# Patient Record
Sex: Male | Born: 1955
Health system: Southern US, Community
[De-identification: ages and names within clinical notes are randomized; demographics above are authoritative.]

## PROBLEM LIST (undated history)

## (undated) DIAGNOSIS — F419 Anxiety disorder, unspecified: Secondary | ICD-10-CM

## (undated) DIAGNOSIS — F32A Depression, unspecified: Secondary | ICD-10-CM

## (undated) DIAGNOSIS — H8109 Meniere's disease, unspecified ear: Secondary | ICD-10-CM

## (undated) DIAGNOSIS — F329 Major depressive disorder, single episode, unspecified: Secondary | ICD-10-CM

## (undated) HISTORY — PX: APPENDECTOMY: SHX54

## (undated) HISTORY — PX: INNER EAR SURGERY: SHX679

## (undated) HISTORY — PX: TONSILLECTOMY: SUR1361

## (undated) HISTORY — PX: SHOULDER ARTHROSCOPY: SHX128

---

## 2005-05-25 ENCOUNTER — Encounter: Admission: RE | Admit: 2005-05-25 | Discharge: 2005-05-25 | Payer: Self-pay | Admitting: Family Medicine

## 2005-06-06 ENCOUNTER — Encounter: Admission: RE | Admit: 2005-06-06 | Discharge: 2005-06-06 | Payer: Self-pay | Admitting: Family Medicine

## 2009-05-21 ENCOUNTER — Encounter: Admission: RE | Admit: 2009-05-21 | Discharge: 2009-05-21 | Payer: Self-pay | Admitting: Family Medicine

## 2009-09-02 ENCOUNTER — Encounter: Admission: RE | Admit: 2009-09-02 | Discharge: 2009-09-02 | Payer: Self-pay | Admitting: Family Medicine

## 2012-12-14 ENCOUNTER — Other Ambulatory Visit: Payer: Self-pay | Admitting: Otolaryngology

## 2012-12-14 DIAGNOSIS — H905 Unspecified sensorineural hearing loss: Secondary | ICD-10-CM

## 2012-12-14 DIAGNOSIS — H903 Sensorineural hearing loss, bilateral: Secondary | ICD-10-CM

## 2012-12-14 DIAGNOSIS — H8109 Meniere's disease, unspecified ear: Secondary | ICD-10-CM

## 2012-12-17 ENCOUNTER — Ambulatory Visit
Admission: RE | Admit: 2012-12-17 | Discharge: 2012-12-17 | Disposition: A | Discharge status: Home / Self Care | Payer: BC Managed Care – PPO | Source: Ambulatory Visit | Attending: Otolaryngology | Admitting: Otolaryngology

## 2012-12-17 DIAGNOSIS — H905 Unspecified sensorineural hearing loss: Secondary | ICD-10-CM

## 2012-12-17 DIAGNOSIS — H8109 Meniere's disease, unspecified ear: Secondary | ICD-10-CM

## 2012-12-17 DIAGNOSIS — H903 Sensorineural hearing loss, bilateral: Secondary | ICD-10-CM

## 2012-12-17 MED ORDER — GADOBENATE DIMEGLUMINE 529 MG/ML IV SOLN
16.0000 mL | Freq: Once | INTRAVENOUS | Status: AC | PRN
  Administered 2012-12-17: 16 mL via INTRAVENOUS

## 2013-06-25 ENCOUNTER — Other Ambulatory Visit: Payer: Self-pay | Admitting: Family Medicine

## 2013-06-25 DIAGNOSIS — R109 Unspecified abdominal pain: Secondary | ICD-10-CM

## 2013-06-25 DIAGNOSIS — R630 Anorexia: Secondary | ICD-10-CM

## 2013-06-26 ENCOUNTER — Ambulatory Visit
Admission: RE | Admit: 2013-06-26 | Discharge: 2013-06-26 | Disposition: A | Discharge status: Home / Self Care | Payer: BC Managed Care – PPO | Source: Ambulatory Visit | Attending: Family Medicine | Admitting: Family Medicine

## 2013-06-26 DIAGNOSIS — R630 Anorexia: Secondary | ICD-10-CM

## 2013-06-26 DIAGNOSIS — R109 Unspecified abdominal pain: Secondary | ICD-10-CM

## 2013-06-26 MED ORDER — IOHEXOL 300 MG/ML  SOLN
100.0000 mL | Freq: Once | INTRAMUSCULAR | Status: AC | PRN
  Administered 2013-06-26: 100 mL via INTRAVENOUS

## 2013-10-04 ENCOUNTER — Other Ambulatory Visit: Payer: Self-pay | Admitting: Family Medicine

## 2013-10-04 DIAGNOSIS — N6310 Unspecified lump in the right breast, unspecified quadrant: Secondary | ICD-10-CM

## 2013-10-15 ENCOUNTER — Ambulatory Visit
Admission: RE | Admit: 2013-10-15 | Discharge: 2013-10-15 | Disposition: A | Discharge status: Home / Self Care | Payer: BC Managed Care – PPO | Source: Ambulatory Visit | Attending: Family Medicine | Admitting: Family Medicine

## 2013-10-15 DIAGNOSIS — N6310 Unspecified lump in the right breast, unspecified quadrant: Secondary | ICD-10-CM

## 2013-12-27 ENCOUNTER — Emergency Department (HOSPITAL_COMMUNITY): Payer: BC Managed Care – PPO

## 2013-12-27 ENCOUNTER — Encounter (HOSPITAL_COMMUNITY): Payer: Self-pay | Admitting: Emergency Medicine

## 2013-12-27 ENCOUNTER — Emergency Department (HOSPITAL_COMMUNITY)
Admission: EM | Admit: 2013-12-27 | Discharge: 2013-12-27 | Disposition: A | Discharge status: Home / Self Care | Payer: BC Managed Care – PPO | Attending: Emergency Medicine | Admitting: Emergency Medicine

## 2013-12-27 DIAGNOSIS — Z79899 Other long term (current) drug therapy: Secondary | ICD-10-CM | POA: Insufficient documentation

## 2013-12-27 DIAGNOSIS — R112 Nausea with vomiting, unspecified: Secondary | ICD-10-CM | POA: Insufficient documentation

## 2013-12-27 DIAGNOSIS — F329 Major depressive disorder, single episode, unspecified: Secondary | ICD-10-CM | POA: Insufficient documentation

## 2013-12-27 DIAGNOSIS — F17200 Nicotine dependence, unspecified, uncomplicated: Secondary | ICD-10-CM | POA: Insufficient documentation

## 2013-12-27 DIAGNOSIS — F411 Generalized anxiety disorder: Secondary | ICD-10-CM | POA: Insufficient documentation

## 2013-12-27 DIAGNOSIS — F1010 Alcohol abuse, uncomplicated: Secondary | ICD-10-CM | POA: Insufficient documentation

## 2013-12-27 DIAGNOSIS — H8109 Meniere's disease, unspecified ear: Secondary | ICD-10-CM | POA: Insufficient documentation

## 2013-12-27 DIAGNOSIS — F3289 Other specified depressive episodes: Secondary | ICD-10-CM | POA: Insufficient documentation

## 2013-12-27 HISTORY — DX: Major depressive disorder, single episode, unspecified: F32.9

## 2013-12-27 HISTORY — DX: Anxiety disorder, unspecified: F41.9

## 2013-12-27 HISTORY — DX: Depression, unspecified: F32.A

## 2013-12-27 HISTORY — DX: Meniere's disease, unspecified ear: H81.09

## 2013-12-27 LAB — COMPREHENSIVE METABOLIC PANEL
ALT: 15 U/L (ref 0–53)
AST: 16 U/L (ref 0–37)
Albumin: 4.1 g/dL (ref 3.5–5.2)
Alkaline Phosphatase: 41 U/L (ref 39–117)
BUN: 12 mg/dL (ref 6–23)
CO2: 20 mEq/L (ref 19–32)
Calcium: 9.5 mg/dL (ref 8.4–10.5)
Chloride: 104 mEq/L (ref 96–112)
Creatinine, Ser: 0.88 mg/dL (ref 0.50–1.35)
GFR calc Af Amer: >90 mL/min (ref >90)
GFR calc non Af Amer: >90 mL/min (ref >90)
Glucose, Bld: 119 mg/dL — ABNORMAL HIGH (ref 70–99)
Potassium: 4.2 mEq/L (ref 3.7–5.3)
Sodium: 141 mEq/L (ref 137–147)
Total Bilirubin: 0.4 mg/dL (ref 0.3–1.2)
Total Protein: 7.7 g/dL (ref 6.0–8.3)

## 2013-12-27 LAB — URINE MICROSCOPIC-ADD ON

## 2013-12-27 LAB — CBC WITH DIFFERENTIAL/PLATELET
Basophils Absolute: 0.1 10*3/uL (ref 0.0–0.1)
Basophils Relative: 1 % (ref 0–1)
Eosinophils Absolute: 0.1 10*3/uL (ref 0.0–0.7)
Eosinophils Relative: 1 % (ref 0–5)
HCT: 49.5 % (ref 39.0–52.0)
Hemoglobin: 17.9 g/dL — ABNORMAL HIGH (ref 13.0–17.0)
Lymphocytes Relative: 11 % — ABNORMAL LOW (ref 12–46)
Lymphs Abs: 1.4 10*3/uL (ref 0.7–4.0)
MCH: 34.7 pg — ABNORMAL HIGH (ref 26.0–34.0)
MCHC: 36.2 g/dL — ABNORMAL HIGH (ref 30.0–36.0)
MCV: 95.9 fL (ref 78.0–100.0)
Monocytes Absolute: 0.7 10*3/uL (ref 0.1–1.0)
Monocytes Relative: 5 % (ref 3–12)
Neutro Abs: 10.9 10*3/uL — ABNORMAL HIGH (ref 1.7–7.7)
Neutrophils Relative %: 83 % — ABNORMAL HIGH (ref 43–77)
Platelets: 297 10*3/uL (ref 150–400)
RBC: 5.16 MIL/uL (ref 4.22–5.81)
RDW: 13.2 % (ref 11.5–15.5)
WBC: 13 10*3/uL — ABNORMAL HIGH (ref 4.0–10.5)

## 2013-12-27 LAB — URINALYSIS, ROUTINE W REFLEX MICROSCOPIC
Bilirubin Urine: NEGATIVE
Glucose, UA: NEGATIVE mg/dL
Hgb urine dipstick: NEGATIVE
Ketones, ur: 15 mg/dL — AB
Leukocytes, UA: NEGATIVE
Nitrite: NEGATIVE
Protein, ur: 100 mg/dL — AB
Specific Gravity, Urine: 1.027 (ref 1.005–1.030)
Urobilinogen, UA: 1 mg/dL (ref 0.0–1.0)
pH: 8 (ref 5.0–8.0)

## 2013-12-27 LAB — LIPASE, BLOOD: Lipase: 35 U/L (ref 11–59)

## 2013-12-27 LAB — ETHANOL: Alcohol, Ethyl (B): <11 mg/dL (ref 0–11)

## 2013-12-27 LAB — POCT I-STAT TROPONIN I: Troponin i, poc: 0 ng/mL (ref 0.00–0.08)

## 2013-12-27 MED ORDER — ONDANSETRON HCL 4 MG/2ML IJ SOLN: 4.0000 mg | Freq: Once | INTRAMUSCULAR | Status: DC

## 2013-12-27 MED ORDER — DICYCLOMINE HCL 20 MG PO TABS: 20.0000 mg | ORAL_TABLET | Freq: Two times a day (BID) | ORAL | Status: DC

## 2013-12-27 MED ORDER — MORPHINE SULFATE 4 MG/ML IJ SOLN
4.0000 mg | Freq: Once | INTRAMUSCULAR | Status: AC
  Administered 2013-12-27: 4 mg via INTRAVENOUS
  Filled 2013-12-27: qty 1

## 2013-12-27 MED ORDER — SODIUM CHLORIDE 0.9 % IV BOLUS (SEPSIS)
1000.0000 mL | Freq: Once | INTRAVENOUS | Status: AC
  Administered 2013-12-27: 1000 mL via INTRAVENOUS

## 2013-12-27 MED ORDER — ONDANSETRON 4 MG PO TBDP: 4.0000 mg | ORAL_TABLET | Freq: Three times a day (TID) | ORAL | Status: DC | PRN

## 2013-12-27 MED ORDER — METOCLOPRAMIDE HCL 5 MG/ML IJ SOLN
10.0000 mg | Freq: Once | INTRAMUSCULAR | Status: AC
  Administered 2013-12-27: 10 mg via INTRAVENOUS

## 2013-12-27 MED ORDER — SODIUM CHLORIDE 0.9 % IV SOLN
1000.0000 mL | Freq: Once | INTRAVENOUS | Status: AC
  Administered 2013-12-27: 1000 mL via INTRAVENOUS

## 2013-12-27 MED ORDER — METOCLOPRAMIDE HCL 5 MG/ML IJ SOLN
10.0000 mg | Freq: Once | INTRAMUSCULAR | Status: DC
  Filled 2013-12-27: qty 2

## 2013-12-27 NOTE — Discharge Instructions (Signed)
Take the prescribed medication as directed.  Continue drinking plenty of fluids to keep yourself hydrated. May wish to start with bland diet and progress as tolerated. Follow-up with [facility] if problems occur. Return to the ED for new or worsening symptoms-- uncontrollable nausea and vomiting, sever abdominal pain, chest pain, SOB, dizziness, diaphoresis, etc.

## 2013-12-27 NOTE — ED Provider Notes (Signed)
CSN: [id number]     Arrival date & time 12/27/13  4098 History   None    Chief Complaint  Patient presents with  . Emesis   (Consider location/radiation/quality/duration/timing/severity/associated sxs/prior Treatment) Patient is a 58 y.o. male presenting with vomiting. The history is provided by the patient and medical records.  Emesis  This is a 58 y.o. M with PMH significant for depression, anxiety, and meniere's disease, presenting to the ED for nausea and vomiting.  States he did not well the entire evening last night, did not eat anything for dinner.  Pt states he went to bed early, woke up around 0200 and began vomiting.  Estimates he has had approximately 8-10 episodes of non-bloody, non-bilious emesis throughout the course of the night.  States he had one episode of feeling as thought he aspirated some emesis into one of his lungs and caused him to feel SOB.  States he began coughing afterward and sx improved.  States he has had some diaphoresis with the vomiting.  Denies any chest pain, palpitations, dizziness, weakness, or abdominal pain.   No recent fevers or chills.  No recent sick contacts with similar sx.  No recent changes in diet or medications.  BM have been normal and non-bloody, last BM was yesterday morning.  Prior appendectomy, no other abdominal surgeries.  VS stable on arrival.  Past Medical History  Diagnosis Date  . Meniere disease   . Depression   . Anxiety    Past Surgical History  Procedure Laterality Date  . Appendectomy    . Tonsillectomy    . Inner ear surgery     History reviewed. No pertinent family history. History  Substance Use Topics  . Smoking status: Current Every Day Smoker -- 1.00 packs/day    Types: Cigarettes  . Smokeless tobacco: Not on file  . Alcohol Use: 1.2 oz/week    2 Shots of liquor per week     Comment: a couple shots every couple days    Review of Systems  Gastrointestinal: Positive for nausea and vomiting.  All other systems  reviewed and are negative.    Allergies  Review of patient's allergies indicates no known allergies.  Home Medications   Current Outpatient Rx  Name  Route  Sig  Dispense  Refill  . ALPRAZolam (XANAX) 0.5 MG tablet   Oral   Take 0.5 mg by mouth 2 (two) times daily.          . DULoxetine (CYMBALTA) 30 MG capsule   Oral   Take 90 mg by mouth daily.          . zaleplon (SONATA) 10 MG capsule   Oral   Take 10 mg by mouth at bedtime as needed for sleep.          BP 159/87  Pulse 69  Temp(Src) 97.9 F (36.6 C) (Oral)  Resp 20  Ht 5\' 8"  (1.727 m)  Wt 170 lb (77.111 kg)  BMI 25.85 kg/m2  SpO2 99%  Physical Exam  Nursing note and vitals reviewed. Constitutional: He is oriented to person, place, and time. He appears well-developed and well-nourished. No distress.  HENT:  Head: Normocephalic and atraumatic.  Mouth/Throat: Oropharynx is clear and moist.  Eyes: Conjunctivae and EOM are normal. Pupils are equal, round, and reactive to light.  Neck: Normal range of motion. Neck supple.  Cardiovascular: Normal rate, regular rhythm and normal heart sounds.   Pulmonary/Chest: Effort normal and breath sounds normal. No respiratory distress. He has  no wheezes.  Abdominal: Soft. Bowel sounds are normal. There is no tenderness. There is no guarding and no CVA tenderness.  Abdomen soft, non-distended, non-tender to palpation  Musculoskeletal: Normal range of motion. He exhibits no edema.  Neurological: He is alert and oriented to person, place, and time.  Skin: Skin is warm and dry. He is not diaphoretic.  Psychiatric: He has a normal mood and affect.    ED Course  Procedures (including critical care time) Labs Review Labs Reviewed  CBC WITH DIFFERENTIAL - Abnormal; Notable for the following:    WBC 13.0 (*)    Hemoglobin 17.9 (*)    MCH 34.7 (*)    MCHC 36.2 (*)    Neutrophils Relative % 83 (*)    Neutro Abs 10.9 (*)    Lymphocytes Relative 11 (*)    All other  components within normal limits  COMPREHENSIVE METABOLIC PANEL - Abnormal; Notable for the following:    Glucose, Bld 119 (*)    All other components within normal limits  URINALYSIS, ROUTINE W REFLEX MICROSCOPIC - Abnormal; Notable for the following:    APPearance CLOUDY (*)    Ketones, ur 15 (*)    Protein, ur 100 (*)    All other components within normal limits  ETHANOL  LIPASE, BLOOD  URINE MICROSCOPIC-ADD ON  POCT I-STAT TROPONIN I   Imaging Review Dg Chest Portable 1 View  12/27/2013   CLINICAL DATA:  Vomiting with possible aspiration.  EXAM: PORTABLE CHEST - 1 VIEW  COMPARISON:  None.  FINDINGS: Interstitial coarsening at the bases. There is minimal scar atelectasis at the right base. No asymmetric opacity, effusion, or pneumothorax. Normal heart size.  IMPRESSION: Interstitial coarsening which is bronchitic. No consolidation or edema.   Electronically Signed   By: Tiburcio PeaJonathan  Watts M.D.   On: 12/27/2013 04:30    EKG Interpretation   None       MDM   1. Nausea and vomiting    Labs as above, mild leukocytosis.  Pts wife expressed to nursing staff that she is concerned with pts EtOH intake, thinks he may have drank too much last night-- ethanol WNL.  On initial evaluation, pt is overall non-toxic appearing.  Mucus membranes somewhat dry, abdominal exam with some generalized tenderness.  Will give additional IVFB, reglan, and morphine.  Will reassess.  Repeat abdominal exam improved without TTP, patient states he just feels somewhat "sore" from all the retching earlier today. He is tolerating by mouth without recurrent nausea or vomiting.  At this time i have low suspicion for acute/surgical abdomen including SBO, diverticulitis, cholecystitis, etc.  Pt afebrile, non-toxic appearing, NAD, VS stable- ok for discharge.  Rx zofran and bentyl.  FU with [facility] if problems occur.  Discussed plan with pt and wife at bedside, acknowledged understanding and agreed with plan of  care.  Return precautions advised for new or worsening sx.  Garlon HatchetLisa M Islam Eichinger, PA-C 12/27/13 0820  Garlon HatchetLisa M Bridey Brookover, PA-C 12/27/13 [phone]

## 2013-12-27 NOTE — ED Notes (Signed)
Reports he did not feel well all evening, did not eat dinner.  Pt in bed resting when he began vomiting at 0030.  Reports vomiting 8-10 times and feels as if something went down his throat into his lung.  C/O shortness of breath.  +diaphoresis with vomiting.

## 2013-12-27 NOTE — ED Notes (Signed)
Iv removed from left ac 2x2 applied with tape.

## 2013-12-27 NOTE — ED Provider Notes (Signed)
Medical screening examination/treatment/procedure(s) were performed by non-physician practitioner and as supervising physician I was immediately available for consultation/collaboration.  EKG Interpretation   None         Christopher Meckley N Wrenn Willcox, DO 12/27/13 1620 

## 2013-12-27 NOTE — ED Notes (Signed)
Pt tolerating PO challenge, PA updated on status.

## 2014-01-09 ENCOUNTER — Other Ambulatory Visit: Payer: Self-pay | Admitting: Otolaryngology

## 2014-01-09 DIAGNOSIS — H903 Sensorineural hearing loss, bilateral: Secondary | ICD-10-CM

## 2014-01-09 DIAGNOSIS — H905 Unspecified sensorineural hearing loss: Secondary | ICD-10-CM

## 2014-01-09 DIAGNOSIS — H8109 Meniere's disease, unspecified ear: Secondary | ICD-10-CM

## 2014-01-15 ENCOUNTER — Ambulatory Visit
Admission: RE | Admit: 2014-01-15 | Discharge: 2014-01-15 | Disposition: A | Discharge status: Home / Self Care | Payer: BC Managed Care – PPO | Source: Ambulatory Visit | Attending: Otolaryngology | Admitting: Otolaryngology

## 2014-01-15 DIAGNOSIS — H905 Unspecified sensorineural hearing loss: Secondary | ICD-10-CM

## 2014-01-15 DIAGNOSIS — H8109 Meniere's disease, unspecified ear: Secondary | ICD-10-CM

## 2014-01-15 DIAGNOSIS — H903 Sensorineural hearing loss, bilateral: Secondary | ICD-10-CM

## 2014-01-15 MED ORDER — GADOBENATE DIMEGLUMINE 529 MG/ML IV SOLN
16.0000 mL | Freq: Once | INTRAVENOUS | Status: AC | PRN
  Administered 2014-01-15: 16 mL via INTRAVENOUS

## 2014-02-03 ENCOUNTER — Encounter: Payer: Self-pay | Admitting: Neurology

## 2014-02-03 ENCOUNTER — Ambulatory Visit (INDEPENDENT_AMBULATORY_CARE_PROVIDER_SITE_OTHER): Payer: BC Managed Care – PPO | Admitting: Neurology

## 2014-02-03 VITALS — BP 100/60 | HR 64 | Temp 97.5°F | Ht 68.0 in | Wt 170.2 lb

## 2014-02-03 DIAGNOSIS — H8190 Unspecified disorder of vestibular function, unspecified ear: Secondary | ICD-10-CM

## 2014-02-03 DIAGNOSIS — R42 Dizziness and giddiness: Secondary | ICD-10-CM

## 2014-02-03 DIAGNOSIS — H8109 Meniere's disease, unspecified ear: Secondary | ICD-10-CM

## 2014-02-03 DIAGNOSIS — H8120 Vestibular neuronitis, unspecified ear: Secondary | ICD-10-CM

## 2014-02-03 NOTE — Progress Notes (Signed)
NEUROLOGY CONSULTATION NOTE  Christopher Winters MRN: [id number] DOB: 09-16-1956  Referring provider: Dr. Dorma Russell Primary care provider: Dr. Kevan Ny  Reason for consult:  Vertigo  HISTORY OF PRESENT ILLNESS: Christopher Winters is a 58 year old right-handed man with history of depression, anxiety and Meniere's disease who presents for headache.  Records and images were personally reviewed where available.    He was diagnosed with left sided Meniere's disease many years ago.  He underwent endolymphatic sec decompression 20 years ago.  He is followed for his Meniere's by Dr. Dorma Russell.  He has constant left ear tinnitus.  His typical attacks start with sensation of burning on the back of his head and neck.  He develops both lightheadedness and spinning sensation, associated with fullness in the left ear and nausea.  There is no associated headache or suboccipital tenderness.  He also has a sensation that his "head is not connected to my body."  Typically the attacks can last several days.  He has about 3 or 4 attacks a year.  Last December, he had a particularly more severe attack.  He was bed-bound for several days.  He also had one day of projectile vomiting lasting several hours.  He was unable to eat.  This caused worsening anxiety attacks.  He went to the ED where he was diagnosed with dehydration and treated with Zofan and IV fluids.  He has been treated with gentamycin injections which have lessened the intensity of vertigo over the years.  Triggers for attacks include change in temperature or barometric changes, or increased anxiety.  Despite these attacks, his hearing has been stable and low-frequency hearing has actually improved in the left ear.  He does have a history of anxiety and depression as well, and is followed by a psychiatrist.  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  He has been prescribed Klonopin, which helps.  He also has history  of stomach problems, which have more recently been under control with acid reflux medication.  Audiogram from 01/08/14 revealed: bilateral asymmetric low and high-frequency sensorineural hearing losses, left ear greater than right ear, with an SRT of 15 dB right ear and 40 dB left ear.  He had 96% discrimination right ear and 80% discrimination left ear.  He has had an increase in his hearing of 30 DB at 1000 Hz and 20 DB at 2000 Hz in the left ear and his right ear is stable.  01/16/14 MRI Brain w/wo: No acute intracranial abnormality.  Mild Chiari malformation (cerebellar tonsils 7.5 mm below foramen magnum) unchanged when compared to prior study from 12/17/12.  He also notes prior history of migraines, described as severe pounding headache associated with nausea.  He hasn't had one in about 2 years.  He also has a remote history of drop attacks, in which he momentarily drops to the ground without loss of consciousness.  This has been attributed to his vestibulopathy.  He also reports occasional numbness in the fingertips when he is working with his hands.  No wrist pain.  He has to shake out his hands.  No neck pain.  PAST MEDICAL HISTORY: Past Medical History  Diagnosis Date  . Meniere disease   . Depression   . Anxiety     PAST SURGICAL HISTORY: Past Surgical History  Procedure Laterality Date  . Appendectomy    . Tonsillectomy    . Inner ear surgery      MEDICATIONS:  Current Outpatient Prescriptions on File Prior to Visit  Medication Sig Dispense Refill  . DULoxetine (CYMBALTA) 30 MG capsule Take 90 mg by mouth daily.       . ondansetron (ZOFRAN ODT) 4 MG disintegrating tablet Take 1 tablet (4 mg total) by mouth every 8 (eight) hours as needed for nausea.  10 tablet  0  . zaleplon (SONATA) 10 MG capsule Take 10 mg by mouth at bedtime as needed for sleep.       No current facility-administered medications on file prior to visit.    ALLERGIES: No Known Allergies  FAMILY  HISTORY: History reviewed. No pertinent family history.  SOCIAL HISTORY: History   Social History  . Marital Status: Married    Spouse Name: N/A    Number of Children: N/A  . Years of Education: N/A   Occupational History  . Not on file.   Social History Main Topics  . Smoking status: Current Every Day Smoker -- 1.00 packs/day    Types: Cigarettes  . Smokeless tobacco: Not on file  . Alcohol Use: 1.2 oz/week    2 Shots of liquor per week     Comment: a couple shots every couple days  . Drug Use: Yes    Special: Marijuana     Comment: 2 times weekly  . Sexual Activity: Yes   Other Topics Concern  . Not on file   Social History Narrative  . No narrative on file    REVIEW OF SYSTEMS: Constitutional: No fevers, chills, or sweats, no generalized fatigue, change in appetite Eyes: No visual changes, double vision, eye pain Ear, nose and throat: No hearing loss, ear pain, nasal congestion, sore throat Cardiovascular: No chest pain, palpitations Respiratory:  No shortness of breath at rest or with exertion, wheezes GastrointestinaI: No nausea, vomiting, diarrhea, abdominal pain, fecal incontinence Genitourinary:  No dysuria, urinary retention or frequency Musculoskeletal:  No neck pain, back pain Integumentary: No rash, pruritus, skin lesions Neurological: as above Psychiatric: No depression, insomnia, anxiety Endocrine: No palpitations, fatigue, diaphoresis, mood swings, change in appetite, change in weight, increased thirst Hematologic/Lymphatic:  No anemia, purpura, petechiae. Allergic/Immunologic: no itchy/runny eyes, nasal congestion, recent allergic reactions, rashes  PHYSICAL EXAM: Filed Vitals:   02/03/14 0900  BP: 100/60  Pulse: 64  Temp: 97.5 F (36.4 C)   General: No acute distress Head:  Normocephalic/atraumatic Neck: supple, no paraspinal tenderness, full range of motion Back: No paraspinal tenderness Heart: regular rate and rhythm Lungs: Clear to  auscultation bilaterally. Vascular: No carotid bruits. Neurological Exam: Mental status: alert and oriented to person, place, and time, speech fluent and not dysarthric, language intact. Cranial nerves: CN I: not tested CN II: pupils equal, round and reactive to light, visual fields intact, fundi unremarkable. CN III, IV, VI:  full range of motion, no nystagmus, no ptosis CN V: facial sensation intact CN VII: upper and lower face symmetric CN VIII: reduced hearing on left. CN IX, X: gag intact, uvula midline CN XI: sternocleidomastoid and trapezius muscles intact CN XII: tongue midline Bulk & Tone: normal, no fasciculations. Motor: 5/5 throughout Sensation: temperature and vibration intact. Deep Tendon Reflexes: 2+ throughout, toes down Finger to nose testing: no dysmetria Heel to shin: no dysmetria Gait: normal stance and stride.  Able to turn, walk on toes, heels and in tandem. Romberg negative.  IMPRESSION: Episodic vertigo.  Besides Meniere's disease, other possibility could be vestibular migraine.  I think the Chiari found on MRI is incidental and not clinically relevant.  PLAN: 1.  When he has another attack, I suggest trying over the counter medication such as Advil or Aleve at earliest onset and see if it aborts the attack sooner. 2.  Follow up in 3 months or as needed.  45 minutes spent with patient, over 50% spent counseling and coordinating care.  Thank you for allowing me to take part in the care of this patient.  Shon MilletAdam Avalynne Diver, DO  CC:  Hollice Espyonna Ruth Gates, MD  Ermalinda BarriosEric Kraus, MD

## 2014-02-03 NOTE — Patient Instructions (Signed)
Thinking outside the box, we can consider a migraine variant as a potential cause of the attacks.  The next time you have an attack, try taking an over the counter pain reliever like Advil or Aleve at earliest onset to see if this stops the attack from progressing.  Call with update.  Let's regroup in 3 months.

## 2014-05-06 ENCOUNTER — Ambulatory Visit (INDEPENDENT_AMBULATORY_CARE_PROVIDER_SITE_OTHER): Payer: BC Managed Care – PPO | Admitting: Neurology

## 2014-05-06 ENCOUNTER — Encounter: Payer: Self-pay | Admitting: Neurology

## 2014-05-06 VITALS — BP 106/70 | HR 70 | Temp 97.7°F | Resp 16 | Wt 168.7 lb

## 2014-05-06 DIAGNOSIS — H8109 Meniere's disease, unspecified ear: Secondary | ICD-10-CM

## 2014-05-06 NOTE — Patient Instructions (Signed)
I'm glad you are feeling better.  It was likely a Meniere's attack.  Call with questions or concerns.

## 2014-05-06 NOTE — Progress Notes (Signed)
NEUROLOGY FOLLOW UP OFFICE NOTE  Christopher Winters [id number]  HISTORY OF PRESENT ILLNESS: Christopher Winters is a 58 year old right-handed man with history of depression, anxiety and Meniere's disease who follows up for episodic vertigo.  UPDATE: No new issues.  Doing well.  No recurrent attacks since prior to seeing me.  HISTORY: He was diagnosed with left sided Meniere's disease many years ago.  He underwent endolymphatic sec decompression 20 years ago.  He is followed for his Meniere's by Dr. Dorma Russell.  He has constant left ear tinnitus.  His typical attacks start with sensation of burning on the back of his head and neck.  He develops both lightheadedness and spinning sensation, associated with fullness in the left ear and nausea.  There is no associated headache or suboccipital tenderness.  He also has a sensation that his "head is not connected to my body."  Typically the attacks can last several days.  He has about 3 or 4 attacks a year.  Last December, he had a particularly more severe attack.  He was bed-bound for several days.  He also had one day of projectile vomiting lasting several hours.  He was unable to eat.  This caused worsening anxiety attacks.  He went to the ED where he was diagnosed with dehydration and treated with Zofran and IV fluids.  He has been treated with gentamycin injections which have lessened the intensity of vertigo over the years.  Triggers for attacks include change in temperature or barometric changes, or increased anxiety.  Despite these attacks, his hearing has been stable and low-frequency hearing has actually improved in the left ear.  He does have a history of anxiety and depression as well, and is followed by a psychiatrist.  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  He has been prescribed Klonopin, which helps.  He also has history of stomach problems, which have more recently been under control with  acid reflux medication.  Audiogram from 01/08/14 revealed: bilateral asymmetric low and high-frequency sensorineural hearing losses, left ear greater than right ear, with an SRT of 15 dB right ear and 40 dB left ear.  He had 96% discrimination right ear and 80% discrimination left ear.  He has had an increase in his hearing of 30 DB at 1000 Hz and 20 DB at 2000 Hz in the left ear and his right ear is stable.  01/16/14 MRI Brain w/wo: No acute intracranial abnormality.  Mild Chiari malformation (cerebellar tonsils 7.5 mm below foramen magnum) unchanged when compared to prior study from 12/17/12.  He also notes prior history of migraines, described as severe pounding headache associated with nausea.  He hasn't had one in about 2 years.  He also has a remote history of drop attacks, in which he momentarily drops to the ground without loss of consciousness.  This has been attributed to his vestibulopathy.  He also reports occasional numbness in the fingertips when he is working with his hands.  No wrist pain. He has to shake out his hands.  No neck pain.  PAST MEDICAL HISTORY: Past Medical History  Diagnosis Date  . Meniere disease   . Depression   . Anxiety     MEDICATIONS: Current Outpatient Prescriptions on File Prior to Visit  Medication Sig Dispense Refill  . clonazePAM (KLONOPIN) 0.5 MG tablet Take 0.5 mg by mouth daily.      . diazepam (VALIUM) 5 MG tablet Take 5  mg by mouth every 6 (six) hours as needed for anxiety.      . DULoxetine (CYMBALTA) 30 MG capsule Take 90 mg by mouth daily.       . meclizine (ANTIVERT) 25 MG tablet Take 25 mg by mouth 3 (three) times daily as needed for dizziness.      . ondansetron (ZOFRAN ODT) 4 MG disintegrating tablet Take 1 tablet (4 mg total) by mouth every 8 (eight) hours as needed for nausea.  10 tablet  0  . zaleplon (SONATA) 10 MG capsule Take 10 mg by mouth at bedtime as needed for sleep.       No current facility-administered medications on file prior  to visit.    ALLERGIES: No Known Allergies  FAMILY HISTORY: No family history on file.  SOCIAL HISTORY: History   Social History  . Marital Status: Married    Spouse Name: N/A    Number of Children: N/A  . Years of Education: N/A   Occupational History  . Not on file.   Social History Main Topics  . Smoking status: Current Every Day Smoker -- 1.00 packs/day    Types: Cigarettes  . Smokeless tobacco: Not on file  . Alcohol Use: 1.2 oz/week    2 Shots of liquor per week     Comment: a couple shots every couple days  . Drug Use: Yes    Special: Marijuana     Comment: 2 times weekly  . Sexual Activity: Yes   Other Topics Concern  . Not on file   Social History Narrative  . No narrative on file    REVIEW OF SYSTEMS: Constitutional: No fevers, chills, or sweats, no generalized fatigue, change in appetite Eyes: No visual changes, double vision, eye pain Ear, nose and throat: No hearing loss, ear pain, nasal congestion, sore throat Cardiovascular: No chest pain, palpitations Respiratory:  No shortness of breath at rest or with exertion, wheezes GastrointestinaI: No nausea, vomiting, diarrhea, abdominal pain, fecal incontinence Genitourinary:  No dysuria, urinary retention or frequency Musculoskeletal:  No neck pain, back pain Integumentary: No rash, pruritus, skin lesions Neurological: as above Psychiatric: No depression, insomnia, anxiety Endocrine: No palpitations, fatigue, diaphoresis, mood swings, change in appetite, change in weight, increased thirst Hematologic/Lymphatic:  No anemia, purpura, petechiae. Allergic/Immunologic: no itchy/runny eyes, nasal congestion, recent allergic reactions, rashes  PHYSICAL EXAM: Filed Vitals:   05/06/14 0929  BP: 106/70  Pulse: 70  Temp: 97.7 F (36.5 C)  Resp: 16   General: No acute distress Head:  Normocephalic/atraumatic Neck: supple, no paraspinal tenderness, full range of motion Heart:  Regular rate and  rhythm Lungs:  Clear to auscultation bilaterally Back: No paraspinal tenderness Neurological Exam: alert and oriented to person, place, and time. Attention span and concentration intact, recent and remote memory intact, fund of knowledge intact.  Speech fluent and not dysarthric, language intact.  CN II-XII intact. Fundoscopic exam unremarkable without vessel changes, exudates, hemorrhages or papilledema.  Bulk and tone normal, muscle strength 5/5 throughout.  Sensation to light touch, temperature and vibration intact.  Deep tendon reflexes 2+ throughout, toes downgoing.  Finger to nose and heel to shin testing intact.  Gait normal, Romberg negative.  IMPRESSION: Probable attack of Meniere's.  Episode was similar to prior attacks.  Doing well.  PLAN: Follow up as needed.  15 minutes spent with the patient, over 50% spent discussing his outcome and likely etiology.  Shon Millet, DO  CC:  Hollice Espy, MD  Ermalinda BarriosEric Kraus, MD

## 2020-02-20 NOTE — Progress Notes (Signed)
Cardiology Office Note:   Date:  02/21/2020  NAME:  Christopher Winters    MRN: [id number] DOB:  Feb 25, 1956   PCP:  Shon Hale, MD  Cardiologist:  No primary care provider on file.   Referring MD: Shon Hale, *   Chief Complaint  Patient presents with  . Shortness of Breath   History of Present Illness:   Christopher Winters is a 64 y.o. male with a hx of depression who is being seen today for the evaluation of shortness of breath at the request of Shon Hale, *.  He reports for the last 3 to 4 months he is noted increased shortness of breath when exerting himself.  He reports activity such as walking on flat surfaces did not give him troubles.  He reports that walking up flights of stairs gets him severely short of breath.  He also reports once a week episodic episodes of pain in his chest.  He reports it can occur at rest while sitting.  He reports he feels like a squeezing pressure pain.  It seems to go away without any intervention.  He reports that he does not get chest pain or pressure when he walks.  He reports that he is unable to predict when it will happen.  His blood pressure today is slightly elevated.  He is on no medication.  He reports his primary care physician has been monitoring this.  His EKG today demonstrates normal sinus rhythm without any acute ischemic changes or evidence of prior infarction.  He is a heavy smoker.  He has nearly a 80-pack-year history.  2 packs a day for 40 years.  He also does report he has COPD but does not use his inhaler that much.  Review of his recent cholesterol profile shows a total cholesterol 157, HDL 67, LDL 79, triglycerides 51.  He has never had a heart attack or stroke.  He reports his brother and father both had heart attacks.  He reports he is quite concerned about this.  He does report 10 pounds of weight loss recently.  He will undergo a colonoscopy soon.  He has not undergone lung cancer screening.  He also reports  numbness and tingling in his hands.  He reports he can get this after sleeping on it.  Seems to start a few months ago.  He does not have any cramping or pain in his legs when he walks.  Past Medical History: Past Medical History:  Diagnosis Date  . Anxiety   . Depression   . Meniere disease     Past Surgical History: Past Surgical History:  Procedure Laterality Date  . APPENDECTOMY    . INNER EAR SURGERY    . SHOULDER ARTHROSCOPY    . TONSILLECTOMY      Current Medications: Current Meds  Medication Sig  . ALPRAZolam (XANAX) 0.5 MG tablet Take 0.5 mg by mouth 2 (two) times daily as needed.  Marland Kitchen atorvastatin (LIPITOR) 20 MG tablet Take 20 mg by mouth daily.  . DULoxetine (CYMBALTA) 30 MG capsule Take 90 mg by mouth daily.   . mirtazapine (REMERON) 15 MG tablet Take 15 mg by mouth at bedtime.  . SYMBICORT 160-4.5 MCG/ACT inhaler Inhale 2 puffs into the lungs 2 (two) times daily.  . traZODone (DESYREL) 100 MG tablet TAKE 4 TABLETS ONE HOUR BEFORE BED.     Allergies:    Patient has no known allergies.   Social History: Social History   Socioeconomic History  .  Marital status: Married    Spouse name: Not on file  . Number of children: 2  . Years of education: Not on file  . Highest education level: Not on file  Occupational History  . Not on file  Tobacco Use  . Smoking status: Current Every Day Smoker    Packs/day: 2.00    Years: 40.00    Pack years: 80.00    Types: Cigarettes  . Smokeless tobacco: Never Used  Substance and Sexual Activity  . Alcohol use: Yes    Alcohol/week: 3.0 standard drinks    Types: 3 Cans of beer per week  . Drug use: Not on file    Comment: 2 times weekly  . Sexual activity: Yes  Other Topics Concern  . Not on file  Social History Narrative  . Not on file   Social Determinants of Health   Financial Resource Strain:   . Difficulty of Paying Living Expenses:   Food Insecurity:   . Worried About Programme researcher, broadcasting/film/video in the Last Year:    . Barista in the Last Year:   Transportation Needs:   . Freight forwarder (Medical):   Marland Kitchen Lack of Transportation (Non-Medical):   Physical Activity:   . Days of Exercise per Week:   . Minutes of Exercise per Session:   Stress:   . Feeling of Stress :   Social Connections:   . Frequency of Communication with Friends and Family:   . Frequency of Social Gatherings with Friends and Family:   . Attends Religious Services:   . Active Member of Clubs or Organizations:   . Attends Banker Meetings:   Marland Kitchen Marital Status:      Family History: The patient's family history includes Heart attack in his brother and father.  ROS:   All other ROS reviewed and negative. Pertinent positives noted in the HPI.     EKGs/Labs/Other Studies Reviewed:   The following studies were personally reviewed by me today:  EKG:  EKG is ordered today.  The ekg ordered today demonstrates normal sinus rhythm, heart rate 74, no acute ST-T changes, no evidence of prior infarction, and was personally reviewed by me.   Recent Labs: No results found for requested labs within last 8760 hours.   Recent Lipid Panel No results found for: CHOL, TRIG, HDL, CHOLHDL, VLDL, LDLCALC, LDLDIRECT  Physical Exam:   VS:  BP 132/84   Pulse 74   Ht 5\' 8"  (1.727 m)   Wt 165 lb 12.8 oz (75.2 kg)   SpO2 93%   BMI 25.21 kg/m    Wt Readings from Last 3 Encounters:  02/21/20 165 lb 12.8 oz (75.2 kg)  05/06/14 168 lb 11.2 oz (76.5 kg)  02/03/14 170 lb 3.2 oz (77.2 kg)    General: Well nourished, well developed, in no acute distress Heart: Atraumatic, normal size  Eyes: PEERLA, EOMI  Neck: Supple, no JVD Endocrine: No thryomegaly Cardiac: Normal S1, S2; RRR; no murmurs, rubs, or gallops Lungs: Clear to auscultation bilaterally, no wheezing, rhonchi or rales  Abd: Soft, nontender, no hepatomegaly  Ext: No edema, pulses 2+ Musculoskeletal: No deformities, BUE and BLE strength normal and equal Skin:  Warm and dry, no rashes   Neuro: Alert and oriented to person, place, time, and situation, CNII-XII grossly intact, no focal deficits  Psych: Normal mood and affect   ASSESSMENT:   [name] is a 64 y.o. male who presents for the following: 1. SOB (  shortness of breath) on exertion   2. Chest pain, unspecified type   3. Mixed hyperlipidemia   4. Tobacco abuse   5. Paresthesia     PLAN:   1. SOB (shortness of breath) on exertion 2. Chest pain, unspecified type -Progressively worsening shortness of breath that occurs with heavy exertion.  EKG without acute ischemic changes or evidence of prior infarction.  He gets no chest pain when he exerts himself.  He does report a central chest pressure that can occur at rest.  Not worsened with activity or alleviated by rest.  The chest pain is atypical in nature.  His significant CVD risk factors include heavy smoking history, 80-pack-year history.  Apparently this is also coincided with a 10 pound weight loss.  He reports he is not trying to lose weight.  We will start with full panel of labs including BNP, BMP, CBC.  We will also obtain a Lexiscan nuclear medicine stress test.  I am concerned given his smoking history he may have significant coronary calcium.  I think we should start with a stress test here and if needed we will pursue a cardiac CTA.  We will also obtain an echocardiogram.  I will also obtain a chest x-ray.  If the above work-up is negative I think he should be evaluated for possible malignancy.  His weight loss is concerning.  He reports no hemoptysis given his significant smoking history.  He will have a colonoscopy soon.  His symptoms also could be related to COPD.  He will use his inhaler to see if symptoms improve.  3. Mixed hyperlipidemia -Most recent LDL 79.  I think it is a good idea for him to stay on statin therapy.  He will continue Lipitor.  4. Tobacco abuse -Counseled extensively on the importance of smoking  cessation.  5. Paresthesia -Symptoms occur mainly in the upper extremity digits.  In the fingers.  I suspect this is likely related to smoking.  He does not have any cold digits on examination today.  We should keep thromboangiitis obliterans in the differential.  The mainstay of treatment is smoking cessation.  He will work on this and feel symptoms go.  Disposition: Return in about 2 months (around 04/22/2020).  Medication Adjustments/Labs and Tests Ordered: Current medicines are reviewed at length with the patient today.  Concerns regarding medicines are outlined above.  Orders Placed This Encounter  Procedures  . DG Chest 2 View  . Basic metabolic panel  . Brain natriuretic peptide  . CBC  . MYOCARDIAL PERFUSION IMAGING  . EKG 12-Lead  . ECHOCARDIOGRAM COMPLETE   No orders of the defined types were placed in this encounter.   Patient Instructions  Medication Instructions:  The current medical regimen is effective;  continue present plan and medications. *If you need a refill on your cardiac medications before your next appointment, please call your pharmacy*   Lab Work: BNP, BMET, CBC today  If you have labs (blood work) drawn today and your tests are completely normal, you will receive your results only by: Marland Kitchen MyChart Message (if you have MyChart) OR . A paper copy in the mail If you have any lab test that is abnormal or we need to change your treatment, we will call you to review the results.   Testing/Procedures: Echocardiogram - Your physician has requested that you have an echocardiogram. Echocardiography is a painless test that uses sound waves to create images of your heart. It provides your doctor with information  about the size and shape of your heart and how well your heart's chambers and valves are working. This procedure takes approximately one hour. There are no restrictions for this procedure. This will be performed at our [facility] location - [address]  [address].  Your physician has requested that you have a lexiscan myoview. A cardiac stress test is a cardiological test that measures the heart's ability to respond to external stress in a controlled clinical environment. The stress response is induced by intravenous pharmacological stimulation.   Chest xray - Your physician has requested that you have a chest xray, is a fast and painless imaging test that uses certain electromagnetic waves to create pictures of the structures in and around your chest. This test can help diagnose and monitor conditions such as pneumonia and other lung issues his will be done at [facility] [address], [place]. If you should need to call them their phone number is [phone].   Follow-Up: At [facility], you and your health needs are our priority.  As part of our continuing mission to provide you with exceptional heart care, we have created designated Provider Care Teams.  These Care Teams include your primary Cardiologist (physician) and Advanced Practice Providers (APPs -  Physician Assistants and Nurse Practitioners) who all work together to provide you with the care you need, when you need it.  We recommend signing up for the patient portal called "MyChart".  Sign up information is provided on this After Visit Summary.  MyChart is used to connect with patients for Virtual Visits (Telemedicine).  Patients are able to view lab/test results, encounter notes, upcoming appointments, etc.  Non-urgent messages can be sent to your provider as well.   To learn more about what you can do with MyChart, go to [email or web address].    Your next appointment:   2 month(s)  The format for your next appointment:   In Person  Provider:   Eleonore Chiquito, MD       Signed, Addison Naegeli. Audie Box, [place]  [address] [place], [place] [address] [phone]  02/21/2020 9:59 AM

## 2020-02-21 ENCOUNTER — Ambulatory Visit
Admission: RE | Admit: 2020-02-21 | Discharge: 2020-02-21 | Disposition: A | Discharge status: Home / Self Care | Payer: 59 | Source: Ambulatory Visit | Attending: Cardiovascular Disease | Admitting: Cardiovascular Disease

## 2020-02-21 ENCOUNTER — Ambulatory Visit: Payer: 59 | Admitting: Cardiovascular Disease

## 2020-02-21 ENCOUNTER — Other Ambulatory Visit: Payer: Self-pay

## 2020-02-21 ENCOUNTER — Encounter: Payer: Self-pay | Admitting: Cardiovascular Disease

## 2020-02-21 ENCOUNTER — Encounter (INDEPENDENT_AMBULATORY_CARE_PROVIDER_SITE_OTHER): Payer: Self-pay

## 2020-02-21 VITALS — BP 132/84 | HR 74 | Ht 68.0 in | Wt 165.8 lb

## 2020-02-21 DIAGNOSIS — R079 Chest pain, unspecified: Secondary | ICD-10-CM

## 2020-02-21 DIAGNOSIS — Z720 Tobacco use: Secondary | ICD-10-CM

## 2020-02-21 DIAGNOSIS — R202 Paresthesia of skin: Secondary | ICD-10-CM

## 2020-02-21 DIAGNOSIS — R0602 Shortness of breath: Secondary | ICD-10-CM | POA: Diagnosis not present

## 2020-02-21 DIAGNOSIS — E782 Mixed hyperlipidemia: Secondary | ICD-10-CM

## 2020-02-21 NOTE — Patient Instructions (Addendum)
Medication Instructions:  The current medical regimen is effective;  continue present plan and medications. *If you need a refill on your cardiac medications before your next appointment, please call your pharmacy*   Lab Work: BNP, BMET, CBC today  If you have labs (blood work) drawn today and your tests are completely normal, you will receive your results only by: Marland Kitchen MyChart Message (if you have MyChart) OR . A paper copy in the mail If you have any lab test that is abnormal or we need to change your treatment, we will call you to review the results.   Testing/Procedures: Echocardiogram - Your physician has requested that you have an echocardiogram. Echocardiography is a painless test that uses sound waves to create images of your heart. It provides your doctor with information about the size and shape of your heart and how well your heart's chambers and valves are working. This procedure takes approximately one hour. There are no restrictions for this procedure. This will be performed at our [facility] location - [address], [address].  Your physician has requested that you have a lexiscan myoview. A cardiac stress test is a cardiological test that measures the heart's ability to respond to external stress in a controlled clinical environment. The stress response is induced by intravenous pharmacological stimulation.   Chest xray - Your physician has requested that you have a chest xray, is a fast and painless imaging test that uses certain electromagnetic waves to create pictures of the structures in and around your chest. This test can help diagnose and monitor conditions such as pneumonia and other lung issues his will be done at [facility] [address], [place]. If you should need to call them their phone number is [phone].   Follow-Up: At [facility], you and your health needs are our priority.  As part of our continuing mission to provide you with exceptional  heart care, we have created designated Provider Care Teams.  These Care Teams include your primary Cardiologist (physician) and Advanced Practice Providers (APPs -  Physician Assistants and Nurse Practitioners) who all work together to provide you with the care you need, when you need it.  We recommend signing up for the patient portal called "MyChart".  Sign up information is provided on this After Visit Summary.  MyChart is used to connect with patients for Virtual Visits (Telemedicine).  Patients are able to view lab/test results, encounter notes, upcoming appointments, etc.  Non-urgent messages can be sent to your provider as well.   To learn more about what you can do with MyChart, go to [email or web address].    Your next appointment:   2 month(s)  The format for your next appointment:   In Person  Provider:   Lennie Odor, MD

## 2020-02-22 LAB — CBC
Hematocrit: 49.2 % (ref 37.5–51.0)
Hemoglobin: 17.5 g/dL (ref 13.0–17.7)
MCH: 35.6 pg — ABNORMAL HIGH (ref 26.6–33.0)
MCHC: 35.6 g/dL (ref 31.5–35.7)
MCV: 100 fL — ABNORMAL HIGH (ref 79–97)
Platelets: 309 10*3/uL (ref 150–450)
RBC: 4.91 x10E6/uL (ref 4.14–5.80)
RDW: 11.9 % (ref 11.6–15.4)
WBC: 9.2 10*3/uL (ref 3.4–10.8)

## 2020-02-22 LAB — BASIC METABOLIC PANEL
BUN/Creatinine Ratio: 11 (ref 10–24)
BUN: 12 mg/dL (ref 8–27)
CO2: 23 mmol/L (ref 20–29)
Calcium: 9.3 mg/dL (ref 8.6–10.2)
Chloride: 99 mmol/L (ref 96–106)
Creatinine, Ser: 1.09 mg/dL (ref 0.76–1.27)
GFR calc Af Amer: 84 mL/min/{1.73_m2} (ref >59)
GFR calc non Af Amer: 72 mL/min/{1.73_m2} (ref >59)
Glucose: 88 mg/dL (ref 65–99)
Potassium: 4.5 mmol/L (ref 3.5–5.2)
Sodium: 138 mmol/L (ref 134–144)

## 2020-02-22 LAB — BRAIN NATRIURETIC PEPTIDE: BNP: 6.2 pg/mL (ref 0.0–100.0)

## 2020-02-27 ENCOUNTER — Telehealth (HOSPITAL_COMMUNITY): Payer: Self-pay

## 2020-02-27 NOTE — Telephone Encounter (Signed)
Encounter complete. 

## 2020-03-03 ENCOUNTER — Ambulatory Visit (HOSPITAL_COMMUNITY)
Admission: RE | Admit: 2020-03-03 | Discharge: 2020-03-03 | Disposition: A | Discharge status: Home / Self Care | Payer: 59 | Source: Ambulatory Visit | Attending: Cardiovascular Disease | Admitting: Cardiovascular Disease

## 2020-03-03 ENCOUNTER — Other Ambulatory Visit: Payer: Self-pay

## 2020-03-03 ENCOUNTER — Encounter: Payer: Self-pay | Admitting: Cardiovascular Disease

## 2020-03-03 DIAGNOSIS — R079 Chest pain, unspecified: Secondary | ICD-10-CM

## 2020-03-06 ENCOUNTER — Telehealth (HOSPITAL_COMMUNITY): Payer: Self-pay

## 2020-03-06 NOTE — Telephone Encounter (Signed)
Encounter complete. 

## 2020-03-09 ENCOUNTER — Ambulatory Visit (HOSPITAL_COMMUNITY): Payer: 59 | Attending: Cardiology

## 2020-03-09 ENCOUNTER — Other Ambulatory Visit: Payer: Self-pay

## 2020-03-09 DIAGNOSIS — R079 Chest pain, unspecified: Secondary | ICD-10-CM | POA: Diagnosis present

## 2020-03-12 ENCOUNTER — Ambulatory Visit (HOSPITAL_COMMUNITY)
Admission: RE | Admit: 2020-03-12 | Discharge: 2020-03-12 | Disposition: A | Discharge status: Home / Self Care | Payer: 59 | Source: Ambulatory Visit | Attending: Cardiology | Admitting: Cardiology

## 2020-03-12 ENCOUNTER — Other Ambulatory Visit: Payer: Self-pay

## 2020-03-12 DIAGNOSIS — R079 Chest pain, unspecified: Secondary | ICD-10-CM | POA: Diagnosis not present

## 2020-03-12 LAB — MYOCARDIAL PERFUSION IMAGING
LV dias vol: 137 mL (ref 62–150)
LV sys vol: 65 mL
Peak HR: 98 {beats}/min
Rest HR: 68 {beats}/min
SDS: 3
SRS: 1
SSS: 4
TID: 1.21

## 2020-03-12 MED ORDER — REGADENOSON 0.4 MG/5ML IV SOLN
0.4000 mg | Freq: Once | INTRAVENOUS | Status: AC
  Administered 2020-03-12: 0.4 mg via INTRAVENOUS

## 2020-03-12 MED ORDER — TECHNETIUM TC 99M TETROFOSMIN IV KIT
30.1000 | PACK | Freq: Once | INTRAVENOUS | Status: AC | PRN
  Administered 2020-03-12: 30.1 via INTRAVENOUS
  Filled 2020-03-12: qty 31

## 2020-03-12 MED ORDER — TECHNETIUM TC 99M TETROFOSMIN IV KIT
9.6000 | PACK | Freq: Once | INTRAVENOUS | Status: AC | PRN
  Administered 2020-03-12: 9.6 via INTRAVENOUS
  Filled 2020-03-12: qty 10

## 2020-04-26 NOTE — Progress Notes (Signed)
Cardiology Office Note:   Date:  04/27/2020  NAME:  Christopher Winters    MRN: [id number] DOB:  1956-09-27   PCP:  Glenis Smoker, MD  Cardiologist:  No primary care provider on file.   Referring MD: Glenis Smoker, *   Chief Complaint  Patient presents with  . Follow-up   History of Present Illness:   Christopher Winters is a 64 y.o. male with a hx of tobacco abuse who presents for follow-up. Evaluated 3/12 for SOB. Echo unremarkable. NM stress normal. CXR with COPD changes.  He reports he is doing well.  He continues to get short of breath when he exerts himself.  Activity such as climbing flights of stairs getting quite winded.  He is taking inhalers for COPD.  His chest x-ray did show COPD changes as well.  He reports no further episodes of chest pain or pressure.  He did have atypical chest pain that mainly occur with rest and not with exertion.  I also had concern about possible malignancy due to his weight loss.  His chest x-ray did not show any evidence of malignancy but simply COPD.  Cholesterol levels were reviewed and they are not that bad.  He reports he is relieved to know that his cardiac testing is unremarkable.  Problem List 1. Tobacco abuse -80 pack-year history 2.  Total cholesterol 157, HDL 67, LDL 79, triglycerides 51  Past Medical History: Past Medical History:  Diagnosis Date  . Anxiety   . Depression   . Meniere disease    Past Surgical History: Past Surgical History:  Procedure Laterality Date  . APPENDECTOMY    . INNER EAR SURGERY    . SHOULDER ARTHROSCOPY    . TONSILLECTOMY     Current Medications: No outpatient medications have been marked as taking for the 04/27/20 encounter (Office Visit) with Geralynn Rile, MD.    Allergies:    Patient has no known allergies.   Social History: Social History   Socioeconomic History  . Marital status: Married    Spouse name: Not on file  . Number of children: 2  . Years of education: Not on  file  . Highest education level: Not on file  Occupational History  . Not on file  Tobacco Use  . Smoking status: Current Every Day Smoker    Packs/day: 2.00    Years: 40.00    Pack years: 80.00    Types: Cigarettes  . Smokeless tobacco: Never Used  Substance and Sexual Activity  . Alcohol use: Yes    Alcohol/week: 3.0 standard drinks    Types: 3 Cans of beer per week  . Drug use: Not on file    Comment: 2 times weekly  . Sexual activity: Yes  Other Topics Concern  . Not on file  Social History Narrative  . Not on file   Social Determinants of Health   Financial Resource Strain:   . Difficulty of Paying Living Expenses:   Food Insecurity:   . Worried About Charity fundraiser in the Last Year:   . Arboriculturist in the Last Year:   Transportation Needs:   . Film/video editor (Medical):   Marland Kitchen Lack of Transportation (Non-Medical):   Physical Activity:   . Days of Exercise per Week:   . Minutes of Exercise per Session:   Stress:   . Feeling of Stress :   Social Connections:   . Frequency of Communication with Friends and Family:   .  Frequency of Social Gatherings with Friends and Family:   . Attends Religious Services:   . Active Member of Clubs or Organizations:   . Attends Banker Meetings:   Marland Kitchen Marital Status:      Family History: The patient's family history includes Heart attack in his brother and father.  ROS:   All other ROS reviewed and negative. Pertinent positives noted in the HPI.     EKGs/Labs/Other Studies Reviewed:   The following studies were personally reviewed by me today:  TTE 03/09/2020 1. Normal LV systolic function; mildly dilated ascending aorta.  2. Left ventricular ejection fraction, by estimation, is 55 to 60%. The  left ventricle has normal function. The left ventricle has no regional  wall motion abnormalities. Left ventricular diastolic parameters were  normal.  3. Right ventricular systolic function is normal.  The right ventricular  size is normal.  4. The mitral valve is normal in structure. Trivial mitral valve  regurgitation. No evidence of mitral stenosis.  5. The aortic valve is tricuspid. Aortic valve regurgitation is not  visualized. No aortic stenosis is present.  6. Aortic dilatation noted. There is mild dilatation of the aortic root  and of the ascending aorta measuring 40 mm.  7. The inferior vena cava is normal in size with greater than 50%  respiratory variability, suggesting right atrial pressure of 3 mmHg.   NM Stress 03/12/2020  The left ventricular ejection fraction is mildly decreased (45-54%).  Nuclear stress EF: 52%.  There was no ST segment deviation noted during stress.  The study is normal.  This is a low risk study.  No prior study for comparison.  Recent Labs: 02/21/2020: BNP 6.2; BUN 12; Creatinine, Ser 1.09; Hemoglobin 17.5; Platelets 309; Potassium 4.5; Sodium 138   Recent Lipid Panel No results found for: CHOL, TRIG, HDL, CHOLHDL, VLDL, LDLCALC, LDLDIRECT  Physical Exam:   VS:  BP 115/73   Pulse 75   Temp (!) 97.2 F (36.2 C)   Ht 5\' 7"  (1.702 m)   Wt 165 lb (74.8 kg)   SpO2 93%   BMI 25.84 kg/m    Wt Readings from Last 3 Encounters:  04/27/20 165 lb (74.8 kg)  03/12/20 165 lb (74.8 kg)  02/21/20 165 lb 12.8 oz (75.2 kg)    General: Well nourished, well developed, in no acute distress Heart: Atraumatic, normal size  Eyes: PEERLA, EOMI  Neck: Supple, no JVD Endocrine: No thryomegaly Cardiac: Normal S1, S2; RRR; no murmurs, rubs, or gallops Lungs: Clear to auscultation bilaterally, no wheezing, rhonchi or rales  Abd: Soft, nontender, no hepatomegaly  Ext: No edema, pulses 2+ Musculoskeletal: No deformities, BUE and BLE strength normal and equal Skin: Warm and dry, no rashes   Neuro: Alert and oriented to person, place, time, and situation, CNII-XII grossly intact, no focal deficits  Psych: Normal mood and affect   ASSESSMENT:   Christopher Winters is a 64 y.o. male who presents for the following: 1. SOB (shortness of breath) on exertion   2. Tobacco abuse     PLAN:   1. SOB (shortness of breath) on exertion -Recent echocardiogram normal.  BNP 6.2.  Nuclear medicine stress test without evidence of ischemia.  Chest x-ray demonstrated COPD and no malignancy.  I highly suspect his symptoms are COPD related.  I recommend further intensification of inhalers per his primary care physician.  Would also consider PFTs for further stratification.  2. Tobacco abuse -Still smoking 2 packs/day.  A pack  years.  Highly concerning for COPD.  He will follow with his primary care physician for this.  Smoking cessation recommended.  Disposition: Return if symptoms worsen or fail to improve.  Medication Adjustments/Labs and Tests Ordered: Current medicines are reviewed at length with the patient today.  Concerns regarding medicines are outlined above.  No orders of the defined types were placed in this encounter.  No orders of the defined types were placed in this encounter.   Patient Instructions  Medication Instructions:  The current medical regimen is effective;  continue present plan and medications.  *If you need a refill on your cardiac medications before your next appointment, please call your pharmacy*   Follow-Up: At [facility], you and your health needs are our priority.  As part of our continuing mission to provide you with exceptional heart care, we have created designated Provider Care Teams.  These Care Teams include your primary Cardiologist (physician) and Advanced Practice Providers (APPs -  Physician Assistants and Nurse Practitioners) who all work together to provide you with the care you need, when you need it.  We recommend signing up for the patient portal called "MyChart".  Sign up information is provided on this After Visit Summary.  MyChart is used to connect with patients for Virtual Visits (Telemedicine).   Patients are able to view lab/test results, encounter notes, upcoming appointments, etc.  Non-urgent messages can be sent to your provider as well.   To learn more about what you can do with MyChart, go to [email or web address].    Your next appointment:   As needed  The format for your next appointment:   In Person  Provider:   Lennie Odor, MD        Time Spent with Patient: I have spent a total of 25 minutes with patient reviewing hospital notes, telemetry, EKGs, labs and examining the patient as well as establishing an assessment and plan that was discussed with the patient.  > 50% of time was spent in direct patient care.  Signed, Lenna Gilford. Flora Lipps, MD [facility]  [address] [place], [place] [address] [phone]  04/27/2020 3:40 PM

## 2020-04-27 ENCOUNTER — Ambulatory Visit: Payer: 59 | Admitting: Cardiovascular Disease

## 2020-04-27 ENCOUNTER — Encounter: Payer: Self-pay | Admitting: Cardiovascular Disease

## 2020-04-27 ENCOUNTER — Other Ambulatory Visit: Payer: Self-pay

## 2020-04-27 VITALS — BP 115/73 | HR 75 | Temp 97.2°F | Ht 67.0 in | Wt 165.0 lb

## 2020-04-27 DIAGNOSIS — R0602 Shortness of breath: Secondary | ICD-10-CM

## 2020-04-27 DIAGNOSIS — Z720 Tobacco use: Secondary | ICD-10-CM | POA: Diagnosis not present

## 2020-04-27 NOTE — Patient Instructions (Signed)
Medication Instructions:  The current medical regimen is effective;  continue present plan and medications.  *If you need a refill on your cardiac medications before your next appointment, please call your pharmacy*    Follow-Up: At [facility], you and your health needs are our priority.  As part of our continuing mission to provide you with exceptional heart care, we have created designated Provider Care Teams.  These Care Teams include your primary Cardiologist (physician) and Advanced Practice Providers (APPs -  Physician Assistants and Nurse Practitioners) who all work together to provide you with the care you need, when you need it.  We recommend signing up for the patient portal called "MyChart".  Sign up information is provided on this After Visit Summary.  MyChart is used to connect with patients for Virtual Visits (Telemedicine).  Patients are able to view lab/test results, encounter notes, upcoming appointments, etc.  Non-urgent messages can be sent to your provider as well.   To learn more about what you can do with MyChart, go to [email or web address].    Your next appointment:   As needed  The format for your next appointment:   In Person  Provider:   Waihee-Waiehu O'Neal, MD      

## 2020-09-02 ENCOUNTER — Ambulatory Visit: Payer: 59 | Admitting: Pulmonary Disease

## 2020-09-02 ENCOUNTER — Encounter: Payer: Self-pay | Admitting: Pulmonary Disease

## 2020-09-02 ENCOUNTER — Other Ambulatory Visit: Payer: Self-pay

## 2020-09-02 VITALS — BP 118/70 | HR 71 | Temp 98.9°F | Ht 67.0 in | Wt 158.8 lb

## 2020-09-02 DIAGNOSIS — Z720 Tobacco use: Secondary | ICD-10-CM

## 2020-09-02 DIAGNOSIS — J449 Chronic obstructive pulmonary disease, unspecified: Secondary | ICD-10-CM | POA: Diagnosis not present

## 2020-09-02 NOTE — Progress Notes (Signed)
Synopsis: Referred by Garth Bigness, MD  for shortness of breath  Subjective:   PATIENT ID: Christopher Winters GENDER: male DOB: January 22, 1956, MRN: [id number]   HPI  Chief Complaint  Patient presents with  . Consult    DOE    Christopher Winters is a 64 year old male, daily smoker with history of anxiety/depression who is referred to pulmonary clinic for shortness of breath.   He reports having shortness of breath over recent months. He has noticed the dyspnea while at work. 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 He denies wheezing or chest tightness. He does have a cough in the morning to clear phlegm. He does report sinus congestion and post-nasal drip. He uses a saline nasal spray as needed.   He has smoked for 50 years. He has smoked 1 pack per day a majority of the time and has smoked 2 packs per day over recent years.  He has been prescribed albuterol inhaler and a Symbicort inhaler. He reports the inhalers are expensive and he has used both of them as needed. He does find relief by using them.  Past Medical History:  Diagnosis Date  . Anxiety   . Depression   . Meniere disease      Family History  Problem Relation Age of Onset  . Heart attack Father   . Heart attack Brother      Social History   Socioeconomic History  . Marital status: Married    Spouse name: Not on file  . Number of children: 2  . Years of education: Not on file  . Highest education level: Not on file  Occupational History  . Not on file  Tobacco Use  . Smoking status: Current Every Day Smoker    Packs/day: 2.00    Years: 40.00    Pack years: 80.00    Types: Cigarettes  . Smokeless tobacco: Never Used  Substance and Sexual Activity  . Alcohol use: Yes    Alcohol/week: 3.0 standard drinks    Types: 3 Cans of beer per week  . Drug use: Not on file    Comment: 2 times weekly   . Sexual activity: Yes  Other Topics Concern  . Not on file  Social History Narrative  . Not on file   Social Determinants of Health   Financial Resource Strain:   . Difficulty of Paying Living Expenses: Not on file  Food Insecurity:   . Worried About Programme researcher, broadcasting/film/video in the Last Year: Not on file  . Ran Out of Food in the Last Year: Not on file  Transportation Needs:   . Lack of Transportation (Medical): Not on file  . Lack of Transportation (Non-Medical): Not on file  Physical Activity:   . Days of Exercise per Week: Not on file  . Minutes of Exercise per Session: Not on file  Stress:   . Feeling of Stress : Not on file  Social Connections:   . Frequency of Communication with Friends and Family: Not on file  . Frequency of Social Gatherings with Friends and Family: Not on file  . Attends Religious Services: Not on file  . Active Member of Clubs or Organizations: Not on file  . Attends Banker Meetings: Not on file  . Marital Status: Not on file  Intimate Partner Violence:   . Fear of Current or Ex-Partner: Not  on file  . Emotionally Abused: Not on file  . Physically Abused: Not on file  . Sexually Abused: Not on file     No Known Allergies   Outpatient Medications Prior to Visit  Medication Sig Dispense Refill  . albuterol (VENTOLIN HFA) 108 (90 Base) MCG/ACT inhaler Inhale into the lungs every 6 (six) hours as needed for wheezing or shortness of breath.    . ALPRAZolam (XANAX) 0.5 MG tablet Take 0.5 mg by mouth 2 (two) times daily as needed.    Marland Kitchen. atorvastatin (LIPITOR) 20 MG tablet Take 20 mg by mouth daily.    . DULoxetine (CYMBALTA) 30 MG capsule Take 90 mg by mouth daily.     . mirtazapine (REMERON) 15 MG tablet Take 15 mg by mouth at bedtime.    . SYMBICORT 160-4.5 MCG/ACT inhaler Inhale 2 puffs into the lungs 2 (two) times daily.    . traZODone (DESYREL) 100 MG tablet TAKE 4 TABLETS ONE HOUR BEFORE BED.     No facility-administered medications  prior to visit.    Review of Systems  Constitutional: Positive for weight loss (10lbs since april). Negative for chills, diaphoresis, fever and malaise/fatigue.  HENT: Positive for congestion. Negative for nosebleeds, sinus pain and sore throat.   Eyes: Negative for blurred vision.  Respiratory: Positive for cough, sputum production and shortness of breath. Negative for hemoptysis and wheezing.   Cardiovascular: Negative for chest pain, palpitations, orthopnea, claudication, leg swelling and PND.  Gastrointestinal: Negative for abdominal pain, blood in stool, diarrhea, heartburn, melena, nausea and vomiting.  Genitourinary: Negative for dysuria, frequency and hematuria.  Musculoskeletal: Negative for back pain, joint pain, myalgias and neck pain.  Skin: Negative for itching and rash.  Neurological: Negative for dizziness, weakness and headaches.  Endo/Heme/Allergies: Does not bruise/bleed easily.  Psychiatric/Behavioral: Negative.     Objective:   Vitals:   09/02/20 1402  BP: 118/70  Pulse: 71  Temp: 98.9 F (37.2 C)  TempSrc: Oral  SpO2: 96%  Weight: 158 lb 12.8 oz (72 kg)  Height: 5\' 7"  (1.702 m)     Physical Exam Constitutional:      General: He is not in acute distress.    Appearance: Normal appearance. He is normal weight. He is not ill-appearing.  HENT:     Head: Normocephalic and atraumatic.     Nose: Nose normal.     Mouth/Throat:     Mouth: Mucous membranes are moist.     Pharynx: Oropharynx is clear.  Eyes:     General: No scleral icterus.    Extraocular Movements: Extraocular movements intact.     Conjunctiva/sclera: Conjunctivae normal.     Pupils: Pupils are equal, round, and reactive to light.  Cardiovascular:     Rate and Rhythm: Normal rate and regular rhythm.     Pulses: Normal pulses.     Heart sounds: Normal heart sounds. No murmur heard.   Pulmonary:     Effort: Pulmonary effort is normal.     Breath sounds: Normal breath sounds. No wheezing,  rhonchi or rales.  Abdominal:     General: Abdomen is flat. Bowel sounds are normal. There is no distension.     Palpations: Abdomen is soft.     Tenderness: There is no abdominal tenderness.  Musculoskeletal:     Cervical back: Neck supple.     Right lower leg: No edema.     Left lower leg: No edema.  Lymphadenopathy:     Cervical: No cervical adenopathy.  Skin:    General: Skin is warm and dry.  Neurological:     General: No focal deficit present.     Mental Status: He is alert and oriented to person, place, and time. Mental status is at baseline.     Gait: Gait normal.  Psychiatric:        Mood and Affect: Mood normal.        Behavior: Behavior normal.        Thought Content: Thought content normal.        Judgment: Judgment normal.     CBC    Component Value Date/Time   WBC 9.2 02/21/2020 1008   WBC 13.0 (H) 12/27/2013 0358   RBC 4.91 02/21/2020 1008   RBC 5.16 12/27/2013 0358   HGB 17.5 02/21/2020 1008   HCT 49.2 02/21/2020 1008   PLT 309 02/21/2020 1008   MCV 100 (H) 02/21/2020 1008   MCH 35.6 (H) 02/21/2020 1008   MCH 34.7 (H) 12/27/2013 0358   MCHC 35.6 02/21/2020 1008   MCHC 36.2 (H) 12/27/2013 0358   RDW 11.9 02/21/2020 1008   LYMPHSABS 1.4 12/27/2013 0358   MONOABS 0.7 12/27/2013 0358   EOSABS 0.1 12/27/2013 0358   BASOSABS 0.1 12/27/2013 0358   BMP Latest Ref Rng & Units 02/21/2020 12/27/2013  Glucose 65 - 99 mg/dL 88 782(N)  BUN 8 - 27 mg/dL 12 12  Creatinine 5.62 - 1.27 mg/dL 1.30 8.65  BUN/Creat Ratio 10 - 24 11 -  Sodium 134 - 144 mmol/L 138 141  Potassium 3.5 - 5.2 mmol/L 4.5 4.2  Chloride 96 - 106 mmol/L 99 104  CO2 20 - 29 mmol/L 23 20  Calcium 8.6 - 10.2 mg/dL 9.3 9.5    Chest imaging: CXR 02/21/20: Blebs at lung bases. Increased retrosternal airspace.  PFT: No PFTs on file  Labs: Reviewed, as above.  Echo: TTE 03/09/2020 1. Normal LV systolic function; mildly dilated ascending aorta.  2. Left ventricular ejection fraction, by  estimation, is 55 to 60%. The  left ventricle has normal function. The left ventricle has no regional  wall motion abnormalities. Left ventricular diastolic parameters were  normal.  3. Right ventricular systolic function is normal. The right ventricular  size is normal.  4. The mitral valve is normal in structure. Trivial mitral valve  regurgitation. No evidence of mitral stenosis.  5. The aortic valve is tricuspid. Aortic valve regurgitation is not  visualized. No aortic stenosis is present.  6. Aortic dilatation noted. There is mild dilatation of the aortic root  and of the ascending aorta measuring 40 mm.  7. The inferior vena cava is normal in size with greater than 50%  respiratory variability, suggesting right atrial pressure of 3 mmHg. Heart Catheterization:  NM Stress 03/12/2020  The left ventricular ejection fraction is mildly decreased (45-54%).  Nuclear stress EF: 52%.  There was no ST segment deviation noted during stress.  The study is normal.  This is a low risk study.  No prior study for comparison.     Assessment & Plan:   Chronic obstructive pulmonary disease, unspecified COPD type (HCC) - Plan: Pulmonary Function Test  Tobacco use - Plan: Ambulatory Referral for Lung Cancer Scre  Discussion: Christopher Winters is a 64 year old male, daily smoker with 50+ pack year smoking history who has been referred to pulmonary clinic for shortness of breath.   He does appear to have underlying obstructive lung disease with blebs noted on chest radiograph along with increased  retrosternal airspace. We will obtain pulmonary function tests to further assess his lung function. He can continue on symbicort 160-4.5mg  2 puffs twice daily and has been instructed to use this inhaler on a daily scheduled basis. He can use albuterol as needed. He does report that the symbicort is expensive and it does not appear to be the first covered choice by his insurance but Breo, Trelegy and  Incruse ellipta do appear to be covered by his insurance. We will determine further inhaler therapy once he has PFTs done.   We will refer him to our lung cancer screening program based on his age and smoking history. He will be scheduled for a low dose lung cancer chest CT.   We discussed smoking cessation. He has a prescription for chantix starter pack. Encouraged patient to start the chantix and set a quit date 1 week from starting the medication. He is also to use nicotine replacement patches and lozenges/gum. He can start with the 21mg  patches and wean down as able.   Patient to follow up in 4 months  , MD [facility] Office: [phone]   Current Outpatient Medications:  .  albuterol (VENTOLIN HFA) 108 (90 Base) MCG/ACT inhaler, Inhale into the lungs every 6 (six) hours as needed for wheezing or shortness of breath., Disp: , Rfl:  .  ALPRAZolam (XANAX) 0.5 MG tablet, Take 0.5 mg by mouth 2 (two) times daily as needed., Disp: , Rfl:  .  atorvastatin (LIPITOR) 20 MG tablet, Take 20 mg by mouth daily., Disp: , Rfl:  .  DULoxetine (CYMBALTA) 30 MG capsule, Take 90 mg by mouth daily. , Disp: , Rfl:  .  mirtazapine (REMERON) 15 MG tablet, Take 15 mg by mouth at bedtime., Disp: , Rfl:  .  SYMBICORT 160-4.5 MCG/ACT inhaler, Inhale 2 puffs into the lungs 2 (two) times daily., Disp: , Rfl:  .  traZODone (DESYREL) 100 MG tablet, TAKE 4 TABLETS ONE HOUR BEFORE BED., Disp: , Rfl:

## 2020-09-02 NOTE — Patient Instructions (Signed)
-   Use symbicort  2 puffs twice daily (rinse mouth out with water after) - Use albuterol 1-2 puffs as needed every 4-6 hours for shortness of breath - We will schedule you for pulmonary function tests - We will refer you to our lung cancer screening program to be set up for a CT Chest scan  Smoking Cessation - Start the chantix starter pack and call our clinic for prescription refill if needed - Start nicotine patches 21mg  daily and can decreased to 14mg  daily after 2-4 weeks and then down to 7mg  after 2-4 weeks - You can use nicotine gum or lozenges for break through cravings

## 2020-09-23 ENCOUNTER — Other Ambulatory Visit: Payer: Self-pay

## 2020-09-23 ENCOUNTER — Ambulatory Visit (INDEPENDENT_AMBULATORY_CARE_PROVIDER_SITE_OTHER): Payer: 59 | Admitting: Pulmonary Disease

## 2020-09-23 DIAGNOSIS — J449 Chronic obstructive pulmonary disease, unspecified: Secondary | ICD-10-CM

## 2020-09-23 LAB — PULMONARY FUNCTION TEST
DL/VA % pred: 63 %
DL/VA: 2.69 ml/min/mmHg/L
DLCO cor % pred: 63 %
DLCO cor: 15.65 ml/min/mmHg
DLCO unc % pred: 63 %
DLCO unc: 15.65 ml/min/mmHg
FEF 25-75 Post: 1.04 L/sec
FEF 25-75 Pre: 0.96 L/sec
FEF2575-%Change-Post: 7 %
FEF2575-%Pred-Post: 40 %
FEF2575-%Pred-Pre: 37 %
FEV1-%Change-Post: 4 %
FEV1-%Pred-Post: 68 %
FEV1-%Pred-Pre: 65 %
FEV1-Post: 2.15 L
FEV1-Pre: 2.06 L
FEV1FVC-%Change-Post: 8 %
FEV1FVC-%Pred-Pre: 80 %
FEV6-%Change-Post: -2 %
FEV6-%Pred-Post: 81 %
FEV6-%Pred-Pre: 83 %
FEV6-Post: 3.24 L
FEV6-Pre: 3.33 L
FEV6FVC-%Change-Post: 0 %
FEV6FVC-%Pred-Post: 103 %
FEV6FVC-%Pred-Pre: 102 %
FVC-%Change-Post: -3 %
FVC-%Pred-Post: 78 %
FVC-%Pred-Pre: 81 %
FVC-Post: 3.3 L
FVC-Pre: 3.42 L
Post FEV1/FVC ratio: 65 %
Post FEV6/FVC ratio: 98 %
Pre FEV1/FVC ratio: 60 %
Pre FEV6/FVC Ratio: 97 %
RV % pred: 133 %
RV: 2.85 L
TLC % pred: 100 %
TLC: 6.41 L

## 2020-09-23 NOTE — Progress Notes (Signed)
PFT done today. 

## 2020-09-28 ENCOUNTER — Other Ambulatory Visit: Payer: Self-pay | Admitting: *Deleted

## 2020-09-28 DIAGNOSIS — F17210 Nicotine dependence, cigarettes, uncomplicated: Secondary | ICD-10-CM

## 2020-10-02 ENCOUNTER — Telehealth: Payer: Self-pay | Admitting: Pulmonary Disease

## 2020-10-02 DIAGNOSIS — J449 Chronic obstructive pulmonary disease, unspecified: Secondary | ICD-10-CM

## 2020-10-02 MED ORDER — STIOLTO RESPIMAT 2.5-2.5 MCG/ACT IN AERS: 2.0000 | INHALATION_SPRAY | Freq: Every day | RESPIRATORY_TRACT | 6 refills | Status: DC

## 2020-10-02 NOTE — Telephone Encounter (Signed)
Please let the patient know he has COPD/Emphysema based on his breathing tests with a moderate degree of obstruction. I have sent in prescription for Stiolto Respimat, 2 puffs daily. This inhaler appears to be covered by his insurance. He can let us know if he has any issues when picking it up at the pharmacy. He can continue albuterol inhaler 1-2 puffs every 4-6 hours as needed.  He can stop the Symbicort inhaler he was previously prescribed.   Thanks, Cletis Athens

## 2020-10-02 NOTE — Telephone Encounter (Signed)
ATC patient, left detailed vm per request on DPR per Dr. Lanora Manis note.  Advised to call back with any questions.

## 2020-10-12 ENCOUNTER — Ambulatory Visit (INDEPENDENT_AMBULATORY_CARE_PROVIDER_SITE_OTHER): Payer: 59 | Admitting: Acute Care

## 2020-10-12 ENCOUNTER — Other Ambulatory Visit: Payer: Self-pay

## 2020-10-12 ENCOUNTER — Ambulatory Visit (INDEPENDENT_AMBULATORY_CARE_PROVIDER_SITE_OTHER)
Admission: RE | Admit: 2020-10-12 | Discharge: 2020-10-12 | Disposition: A | Discharge status: Home / Self Care | Payer: 59 | Source: Ambulatory Visit | Attending: Acute Care | Admitting: Acute Care

## 2020-10-12 ENCOUNTER — Encounter: Payer: Self-pay | Admitting: Acute Care

## 2020-10-12 VITALS — BP 132/72 | HR 70 | Temp 97.7°F | Ht 67.0 in | Wt 158.6 lb

## 2020-10-12 DIAGNOSIS — F17210 Nicotine dependence, cigarettes, uncomplicated: Secondary | ICD-10-CM | POA: Diagnosis not present

## 2020-10-12 DIAGNOSIS — Z720 Tobacco use: Secondary | ICD-10-CM

## 2020-10-12 DIAGNOSIS — Z122 Encounter for screening for malignant neoplasm of respiratory organs: Secondary | ICD-10-CM

## 2020-10-12 MED ORDER — VARENICLINE TARTRATE 0.5 MG X 11 & 1 MG X 42 PO MISC: ORAL | 0 refills | Status: DC

## 2020-10-12 NOTE — Patient Instructions (Signed)
Thank you for participating in the [facility]. It was our pleasure to meet you today. We will call you with the results of your scan within the next few days. Your scan will be assigned a Lung RADS category score by the physicians reading the scans.  This Lung RADS score determines follow up scanning.  See below for description of categories, and follow up screening recommendations. We will be in touch to schedule your follow up screening annually or based on recommendations of our providers. We will fax a copy of your scan results to your Primary Care Physician, or the physician who referred you to the program, to ensure they have the results. Please call the office if you have any questions or concerns regarding your scanning experience or results.  Our office number is [phone]. Please speak with Denise Phelps, RN. She is our Lung Cancer Screening RN. If she is unavailable when you call, please have the office staff send her a message. She will return your call at her earliest convenience. Remember, if your scan is normal, we will scan you annually as long as you continue to meet the criteria for the program. (Age 55-77, Current smoker or smoker who has quit within the last 15 years). If you are a smoker, remember, quitting is the single most powerful action that you can take to decrease your risk of lung cancer and other pulmonary, breathing related problems. We know quitting is hard, and we are here to help.  Please let us know if there is anything we can do to help you meet your goal of quitting. If you are a former smoker, congratulations. We are proud of you! Remain smoke free! Remember you can refer friends or family members through the number above.  We will screen them to make sure they meet criteria for the program. Thank you for helping us take better care of you by participating in Lung Screening.  Lung RADS Categories:  Lung RADS 1: no nodules  or definitely non-concerning nodules.  Recommendation is for a repeat annual scan in 12 months.  Lung RADS 2:  nodules that are non-concerning in appearance and behavior with a very low likelihood of becoming an active cancer. Recommendation is for a repeat annual scan in 12 months.  Lung RADS 3: nodules that are probably non-concerning , includes nodules with a low likelihood of becoming an active cancer.  Recommendation is for a 6-month repeat screening scan. Often noted after an upper respiratory illness. We will be in touch to make sure you have no questions, and to schedule your 6-month scan.  Lung RADS 4 A: nodules with concerning findings, recommendation is most often for a follow up scan in 3 months or additional testing based on our provider's assessment of the scan. We will be in touch to make sure you have no questions and to schedule the recommended 3 month follow up scan.  Lung RADS 4 B:  indicates findings that are concerning. We will be in touch with you to schedule additional diagnostic testing based on our provider's  assessment of the scan.   

## 2020-10-12 NOTE — Progress Notes (Signed)
Shared Decision Making Visit Lung Cancer Screening Program [phone])   Eligibility:  Age 64 y.o.  Pack Years Smoking History Calculation 96 pack year smoking history (# packs/per year x # years smoked)  Recent History of coughing up blood  no  Unexplained weight loss? no ( >Than 15 pounds within the last 6 months )  Prior History Lung / other cancer no (Diagnosis within the last 5 years already requiring surveillance chest CT Scans).  Smoking Status Current Smoker  Former Smokers: Years since quit: NA  Quit Date: NA  Visit Components:  Discussion included one or more decision making aids. yes  Discussion included risk/benefits of screening. yes  Discussion included potential follow up diagnostic testing for abnormal scans. yes  Discussion included meaning and risk of over diagnosis. yes  Discussion included meaning and risk of False Positives. yes  Discussion included meaning of total radiation exposure. yes  Counseling Included:  Importance of adherence to annual lung cancer LDCT screening. yes  Impact of comorbidities on ability to participate in the program. yes  Ability and willingness to under diagnostic treatment. yes  Smoking Cessation Counseling:  Current Smokers:   Discussed importance of smoking cessation. yes  Information about tobacco cessation classes and interventions provided to patient. yes  Patient provided with "ticket" for LDCT Scan. yes  Symptomatic Patient. no  Counseling  Diagnosis Code: Tobacco Use Z72.0  Asymptomatic Patient yes  Counseling (Intermediate counseling: > three minutes counseling) V7858  Former Smokers:   Discussed the importance of maintaining cigarette abstinence. yes  Diagnosis Code: Personal History of Nicotine Dependence. I50.277  Information about tobacco cessation classes and interventions provided to patient. Yes  Patient provided with "ticket" for LDCT Scan. yes  Written Order for Lung Cancer  Screening with LDCT placed in Epic. Yes (CT Chest Lung Cancer Screening Low Dose W/O CM) [id number] Z12.2-Screening of respiratory organs Z87.891-Personal history of nicotine dependence  BP 132/72   Pulse 70   Temp 97.7 F (36.5 C) (Oral)   Ht 5\' 7"  (1.702 m)   Wt 158 lb 9.6 oz (71.9 kg)   SpO2 98%   BMI 24.84 kg/m    I have spent 25 minutes of face to face time with Christopher Winters discussing the risks and benefits of lung cancer screening. We viewed a power point together that explained in detail the above noted topics. We paused at intervals to allow for questions to be asked and answered to ensure understanding.We discussed that the single most powerful action that he can take to decrease his risk of developing lung cancer is to quit smoking. We discussed whether or not he is ready to commit to setting a quit date. We discussed options for tools to aid in quitting smoking including nicotine replacement therapy, non-nicotine medications, support groups, Quit Smart classes, and behavior modification. We discussed that often times setting smaller, more achievable goals, such as eliminating 1 cigarette a day for a week and then 2 cigarettes a day for a week can be helpful in slowly decreasing the number of cigarettes smoked. This allows for a sense of accomplishment as well as providing a clinical benefit. I gave him the " Be Stronger Than Your Excuses" card with contact information for community resources, classes, free nicotine replacement therapy, and access to mobile apps, text messaging, and on-line smoking cessation help. I have also given him my card and contact information in the event he needs to contact me. We discussed the time and location of the scan, and  that either Christopher Miyamoto RN or I will call with the results within 24-48 hours of receiving them. I have offered him  a copy of the power point we viewed  as a resource in the event they need reinforcement of the concepts we discussed today  in the office. The patient verbalized understanding of all of  the above and had no further questions upon leaving the office. They have my contact information in the event they have any further questions.  I spent 3 minutes counseling on smoking cessation and the health risks of continued tobacco abuse.  I explained to the patient that there has been a high incidence of coronary artery disease noted on these exams. I explained that this is a non-gated exam therefore degree or severity cannot be determined. This patient is currently on statin therapy. I have asked the patient to follow-up with their PCP regarding any incidental finding of coronary artery disease and management with diet or medication as their PCP  feels is clinically indicated. The patient verbalized understanding of the above and had no further questions upon completion of the visit.  Pt. Has completed one Chantix starter pack. He has cut his smoking in half. He has been advised to use sugar free candy to help with his oral fixation. I have sent in an additional pack of Chantix, as he has completed the first pack.      Bevelyn Ngo, NP 10/12/2020 3:32 PM

## 2020-10-15 ENCOUNTER — Other Ambulatory Visit: Payer: Self-pay | Admitting: *Deleted

## 2020-10-15 DIAGNOSIS — F17210 Nicotine dependence, cigarettes, uncomplicated: Secondary | ICD-10-CM

## 2020-10-15 NOTE — Progress Notes (Signed)
OK, Dr. Molli Posey had addended his initial read to a 6 month follow up.  Please schedule for 04/2021. Thanks so much Dorado. I have called the patient and let him know. He verbalized understanding.

## 2020-10-15 NOTE — Progress Notes (Signed)
I have called the patient with the results of his LDCT. I explained that his scan was read as a Lung  RADS 3, nodules that are probably benign findings, short term follow up suggested: includes nodules with a low likelihood of becoming a clinically active cancer. Radiology recommends a 3 month repeat LDCT follow up.  Pt. Verbalized understanding and had no further questions.  Angelique Blonder,  This is a shorter interval than the usual LR 3 , so I have messaged Dr. Molli Posey and asked him of there are additional concerns warranting the 3 month vs usual 6 month follow up for a LR -3 . Go ahead and place a 3 month follow up, I will let you know when he gets back to me . We can change it to a 6 month at that point, based on his feedback. Thanks so much

## 2021-01-12 ENCOUNTER — Other Ambulatory Visit: Payer: Self-pay | Admitting: Pulmonary Disease

## 2021-01-13 ENCOUNTER — Other Ambulatory Visit: Payer: Self-pay | Admitting: Pulmonary Disease

## 2021-02-15 ENCOUNTER — Other Ambulatory Visit: Payer: Self-pay | Admitting: Family Medicine

## 2021-02-15 DIAGNOSIS — R200 Anesthesia of skin: Secondary | ICD-10-CM

## 2021-02-15 DIAGNOSIS — M5412 Radiculopathy, cervical region: Secondary | ICD-10-CM

## 2021-03-07 ENCOUNTER — Other Ambulatory Visit: Payer: Self-pay

## 2021-03-07 ENCOUNTER — Ambulatory Visit
Admission: RE | Admit: 2021-03-07 | Discharge: 2021-03-07 | Disposition: A | Discharge status: Home / Self Care | Payer: 59 | Source: Ambulatory Visit | Attending: Family Medicine | Admitting: Family Medicine

## 2021-03-07 DIAGNOSIS — M5412 Radiculopathy, cervical region: Secondary | ICD-10-CM

## 2021-03-07 DIAGNOSIS — R200 Anesthesia of skin: Secondary | ICD-10-CM

## 2022-03-21 ENCOUNTER — Encounter: Payer: Self-pay | Admitting: Physical Medicine & Rehabilitation

## 2022-04-21 ENCOUNTER — Other Ambulatory Visit: Payer: Self-pay | Admitting: Pulmonary Disease

## 2022-05-17 ENCOUNTER — Encounter: Payer: Self-pay | Admitting: Physical Medicine & Rehabilitation

## 2022-05-17 ENCOUNTER — Encounter: Payer: 59 | Attending: Physical Medicine & Rehabilitation | Admitting: Physical Medicine & Rehabilitation

## 2022-05-17 VITALS — BP 114/75 | HR 77 | Temp 98.3°F | Ht 67.0 in | Wt 167.2 lb

## 2022-05-17 DIAGNOSIS — R200 Anesthesia of skin: Secondary | ICD-10-CM | POA: Diagnosis present

## 2022-05-17 NOTE — Progress Notes (Signed)
Patient referred by neurosurgery for EMG/NCV bilateral upper extremities to evaluate upper extremity numbness tingling. MRI of the cervical spine dated 03/07/2021 demonstrated a new small disc protrusion at C5-6.  No spinal cord signal abnormalities, no foraminal stenosis noted at this or other levels.  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

## 2022-05-17 NOTE — Patient Instructions (Signed)
Please follow up with Dr Maisie Fus , he will have the results of this test tomorrow

## 2022-11-24 IMAGING — MR MR CERVICAL SPINE W/O CM
5 series · 37 of 48 positions shown · non-contrast
Comparison: Cervical spine MRI 06/06/2005.

CLINICAL DATA: Provided history: Cervical radiculopathy. Numbness
of fingers of both hands. Additional history provided by
technologist: Patient reports numbness in both hands, more severe on
the left, pain increases in left hand at night during sleep,
symptoms for 1 year.

EXAM:
MRI CERVICAL SPINE WITHOUT CONTRAST
TECHNIQUE: Multiplanar, multisequence MR imaging of the cervical spine was
performed. No intravenous contrast was administered.

[Series 2: T2 · sagittal · 3.0mm · 0.41mm/px · 8 of 17 slices shown (1 of 2)]
[im 1/17]
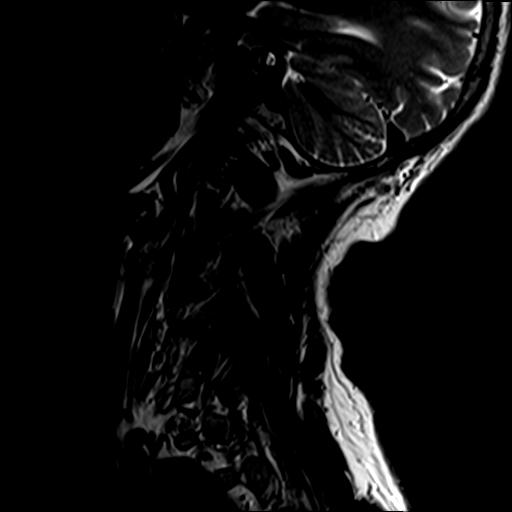
[im 3/17]
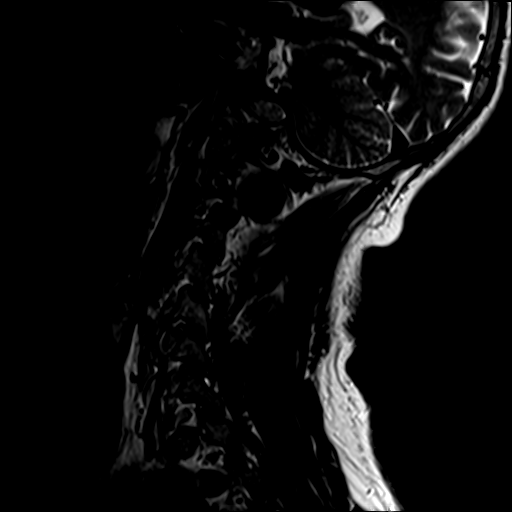
[im 5/17]
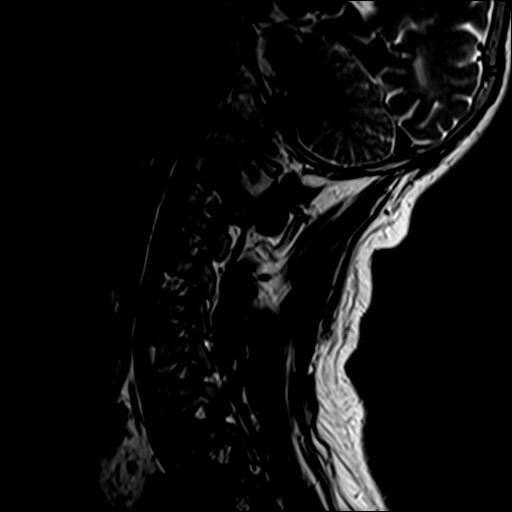
[im 7/17]
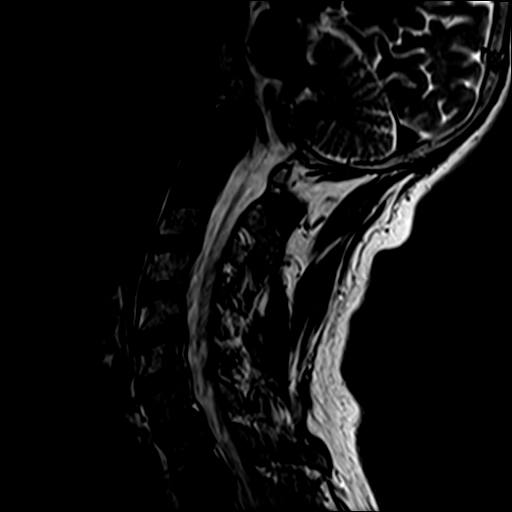
[im 10/17]
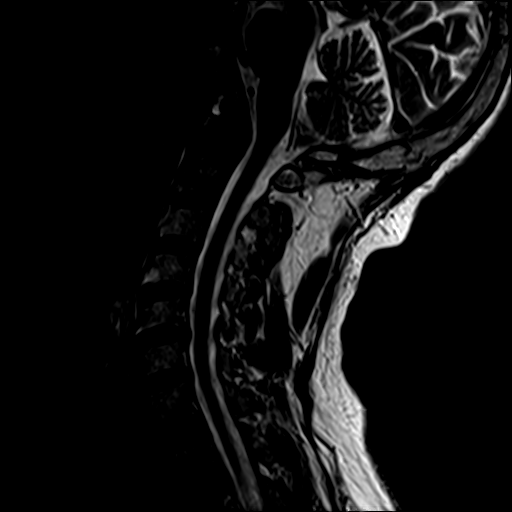
[im 12/17]
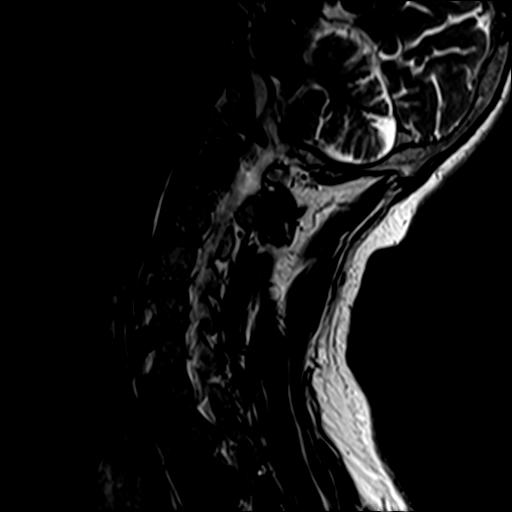
[im 14/17]
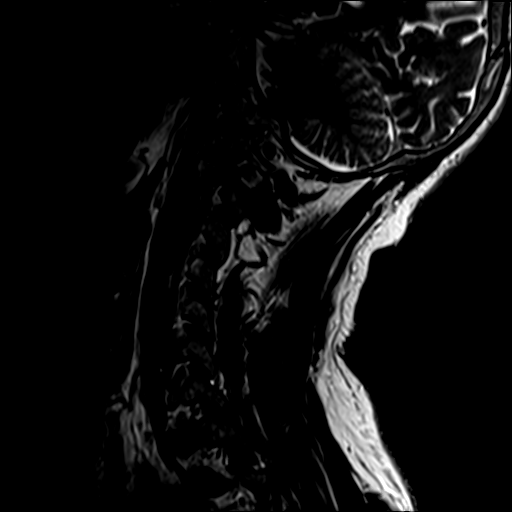
[im 17/17]
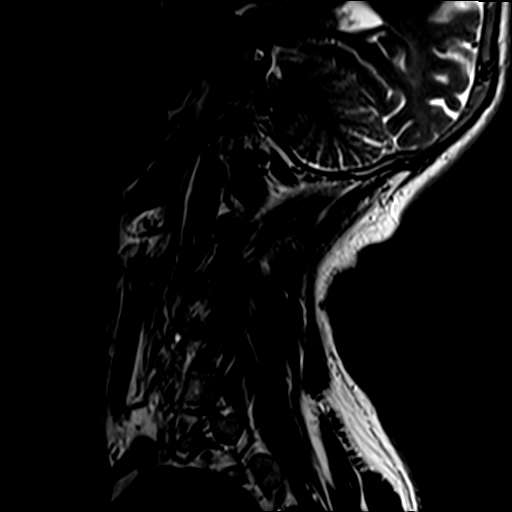

[Series 3: STIR · sagittal · 3.0mm · 0.82mm/px · 8 of 17 slices shown]
[im 1/17]
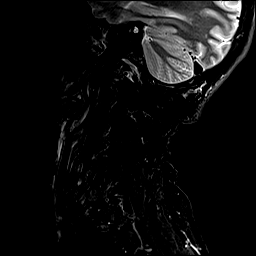
[im 3/17]
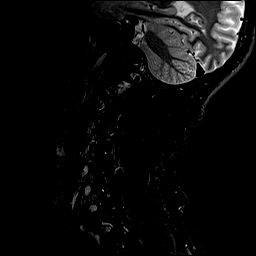
[im 5/17]
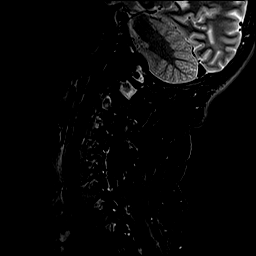
[im 7/17]
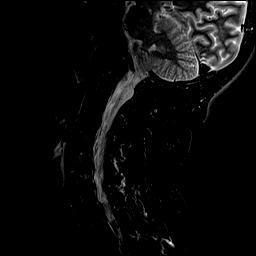
[im 10/17]
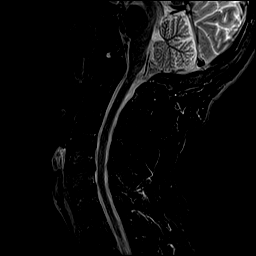
[im 12/17]
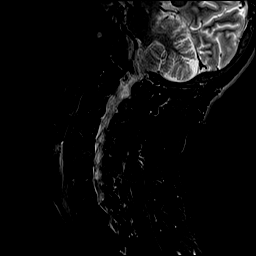
[im 14/17]
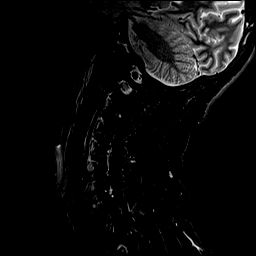
[im 17/17]
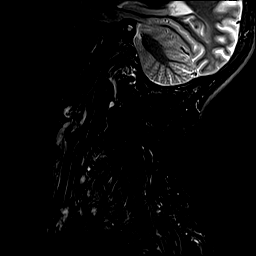

[Series 4: T1 · sagittal · 3.0mm · 0.82mm/px · 8 of 17 slices shown]
[im 1/17]
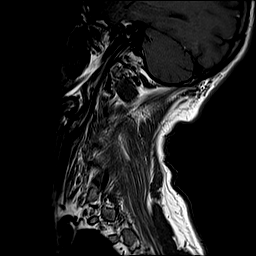
[im 3/17]
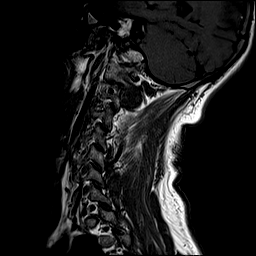
[im 5/17]
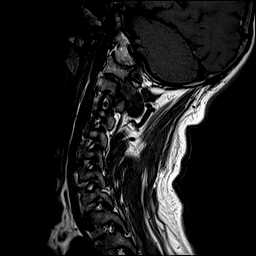
[im 7/17]
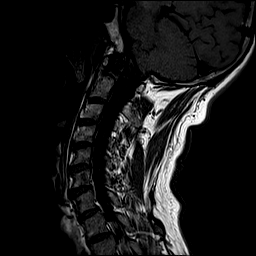
[im 10/17]
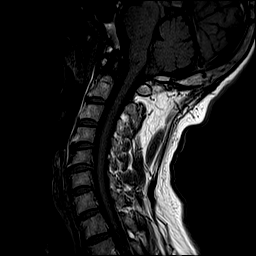
[im 12/17]
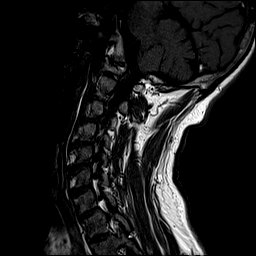
[im 14/17]
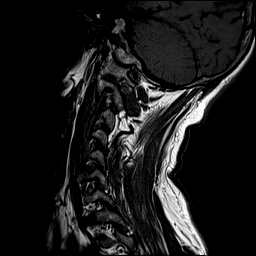
[im 17/17]
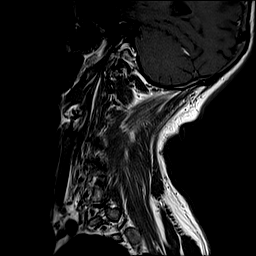

[Series 5: T2 · axial · 3.0mm · 0.70mm/px · z∈[-81,-1]mm · 11 of 22 slices shown (2 of 2)]
[im 1/22]
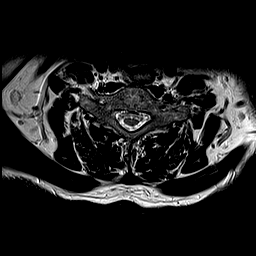
[im 3/22]
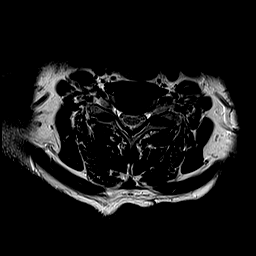
[im 5/22]
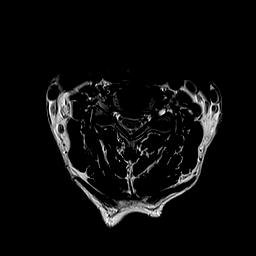
[im 7/22]
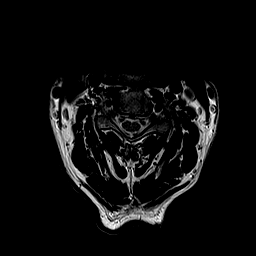
[im 9/22]
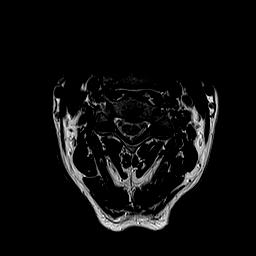
[im 11/22]
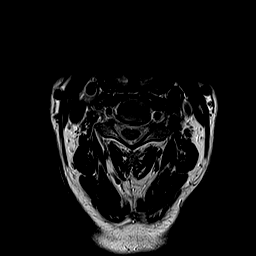
[im 13/22]
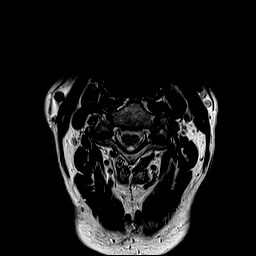
[im 15/22]
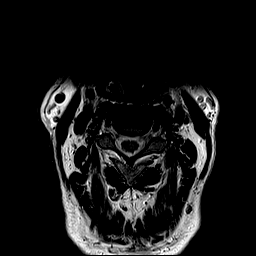
[im 17/22]
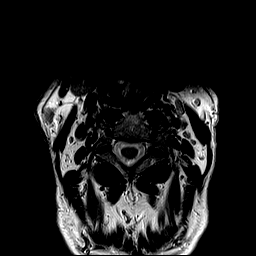
[im 19/22]
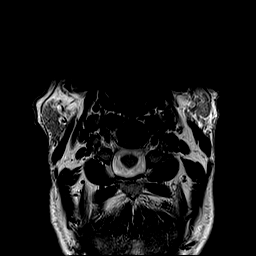
[im 22/22]
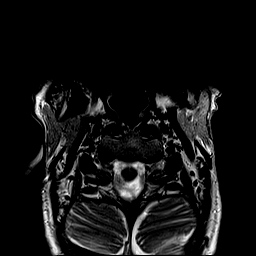

[Series 6: GRE · axial · 3.0mm · 0.35mm/px · z∈[-85,-71]mm · 2 of 26 slices shown]
[im 1/26]
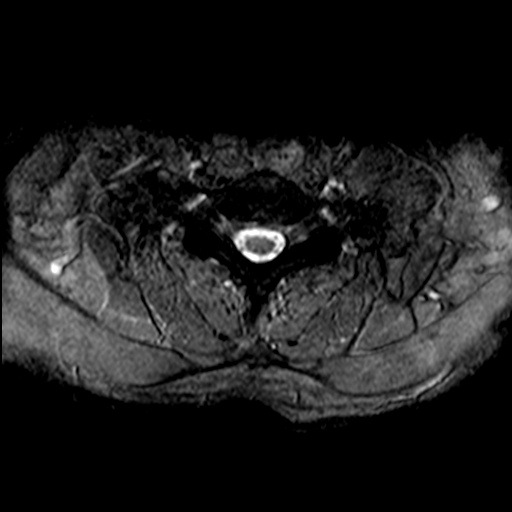
[im 5/26]
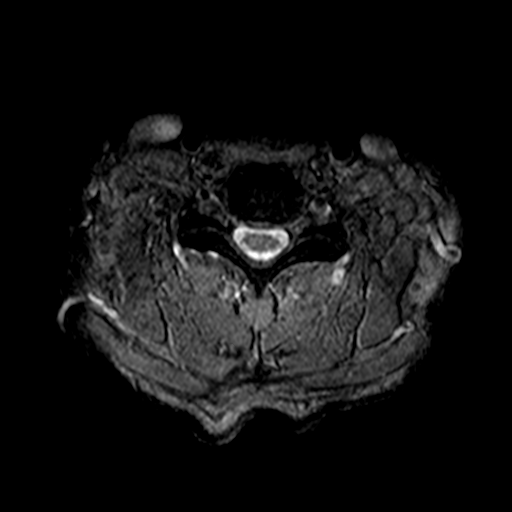

[37 of 48 positions shown; findings below may reference images not displayed]

FINDINGS: Alignment: No significant spondylolisthesis.

Vertebrae: Vertebral body height is maintained. No significant
marrow edema or focal suspicious osseous lesion.

Cord: No spinal cord signal abnormality is identified.

Posterior Fossa, vertebral arteries, paraspinal tissues: Previously
described cerebellar tonsillar herniation appears less conspicuous
as compared to the prior cervical spine MRI of 06/06/2005. The right
cerebellar tonsil appears to now only extend approximately 1-2 mm
below the level of the foramen magnum. Flow voids preserved within
the imaged cervical vertebral arteries. Paraspinal soft tissues
within normal limits.

Disc levels:

Unless otherwise stated, the level by level findings below have not
significantly changed since prior MRI 06/06/2005.

Progressive mild multilevel disc degeneration. Disc degeneration is
greatest at C5-C6 and C6-C7.

C2-C3: Tiny central disc protrusion, new from the prior exam. No
significant spinal canal or foraminal stenosis.

C3-C4: Minimal disc bulge, new from the prior exam. No significant
spinal canal or foraminal stenosis.

C4-C5: Mild disc bulge, new from the prior exam. No significant
spinal canal or foraminal stenosis.

C5-C6: Progressive disc bulge with new superimposed small central
disc protrusion. Progressive endplate spurring and mild
uncovertebral hypertrophy. Mild relative spinal canal narrowing is
new from the prior exam. No spinal cord mass effect. No significant
foraminal stenosis.

C6-C7: Mild disc bulge, new from the prior exam. No significant
spinal canal stenosis or foraminal narrowing.

C7-T1: Imaged sagittally. No significant disc herniation or
stenosis.
IMPRESSION: Cervical spondylosis as outlined and having progressed at multiple
levels since the prior cervical spine MRI of 06/06/2005.

Most notably at C5-C6, there is progressive mild disc degeneration.
Progressive disc bulge with new superimposed small central disc
protrusion. There is now mild relative spinal canal narrowing at
this level without spinal cord mass effect. No significant foraminal
stenosis.

No significant spinal canal or foraminal stenosis at the remaining
levels.

## 2023-01-10 ENCOUNTER — Encounter: Payer: Self-pay | Admitting: *Deleted

## 2023-01-30 ENCOUNTER — Other Ambulatory Visit: Payer: Self-pay | Admitting: *Deleted

## 2023-01-30 DIAGNOSIS — Z87891 Personal history of nicotine dependence: Secondary | ICD-10-CM

## 2023-01-30 DIAGNOSIS — F17210 Nicotine dependence, cigarettes, uncomplicated: Secondary | ICD-10-CM

## 2023-01-30 DIAGNOSIS — Z122 Encounter for screening for malignant neoplasm of respiratory organs: Secondary | ICD-10-CM

## 2023-02-20 ENCOUNTER — Ambulatory Visit
Admission: RE | Admit: 2023-02-20 | Discharge: 2023-02-20 | Disposition: A | Discharge status: Home / Self Care | Payer: 59 | Source: Ambulatory Visit | Attending: Acute Care | Admitting: Acute Care

## 2023-02-20 DIAGNOSIS — Z122 Encounter for screening for malignant neoplasm of respiratory organs: Secondary | ICD-10-CM

## 2023-02-20 DIAGNOSIS — F17210 Nicotine dependence, cigarettes, uncomplicated: Secondary | ICD-10-CM

## 2023-02-20 DIAGNOSIS — Z87891 Personal history of nicotine dependence: Secondary | ICD-10-CM

## 2023-02-22 ENCOUNTER — Other Ambulatory Visit: Payer: Self-pay | Admitting: Acute Care

## 2023-02-22 DIAGNOSIS — Z122 Encounter for screening for malignant neoplasm of respiratory organs: Secondary | ICD-10-CM

## 2023-02-22 DIAGNOSIS — Z87891 Personal history of nicotine dependence: Secondary | ICD-10-CM

## 2023-02-22 DIAGNOSIS — F17210 Nicotine dependence, cigarettes, uncomplicated: Secondary | ICD-10-CM

## 2023-05-15 ENCOUNTER — Encounter (HOSPITAL_BASED_OUTPATIENT_CLINIC_OR_DEPARTMENT_OTHER): Payer: Self-pay | Admitting: Emergency Medicine

## 2023-05-15 ENCOUNTER — Other Ambulatory Visit: Payer: Self-pay

## 2023-05-15 ENCOUNTER — Emergency Department (HOSPITAL_BASED_OUTPATIENT_CLINIC_OR_DEPARTMENT_OTHER)
Admission: EM | Admit: 2023-05-15 | Discharge: 2023-05-15 | Disposition: A | Discharge status: Home / Self Care | Payer: 59 | Attending: Emergency Medicine | Admitting: Emergency Medicine

## 2023-05-15 ENCOUNTER — Emergency Department (HOSPITAL_BASED_OUTPATIENT_CLINIC_OR_DEPARTMENT_OTHER): Payer: 59

## 2023-05-15 ENCOUNTER — Other Ambulatory Visit (HOSPITAL_BASED_OUTPATIENT_CLINIC_OR_DEPARTMENT_OTHER): Payer: Self-pay

## 2023-05-15 DIAGNOSIS — Z7982 Long term (current) use of aspirin: Secondary | ICD-10-CM | POA: Diagnosis not present

## 2023-05-15 DIAGNOSIS — R109 Unspecified abdominal pain: Secondary | ICD-10-CM

## 2023-05-15 LAB — BASIC METABOLIC PANEL
Anion gap: 5 (ref 5–15)
BUN: 11 mg/dL (ref 8–23)
CO2: 29 mmol/L (ref 22–32)
Calcium: 9.4 mg/dL (ref 8.9–10.3)
Chloride: 102 mmol/L (ref 98–111)
Creatinine, Ser: 1.03 mg/dL (ref 0.61–1.24)
GFR, Estimated: >60 mL/min (ref >60)
Glucose, Bld: 123 mg/dL — ABNORMAL HIGH (ref 70–99)
Potassium: 4.1 mmol/L (ref 3.5–5.1)
Sodium: 136 mmol/L (ref 135–145)

## 2023-05-15 LAB — CBC
HCT: 48.9 % (ref 39.0–52.0)
Hemoglobin: 16.8 g/dL (ref 13.0–17.0)
MCH: 34.4 pg — ABNORMAL HIGH (ref 26.0–34.0)
MCHC: 34.4 g/dL (ref 30.0–36.0)
MCV: 100.2 fL — ABNORMAL HIGH (ref 80.0–100.0)
Platelets: 271 10*3/uL (ref 150–400)
RBC: 4.88 MIL/uL (ref 4.22–5.81)
RDW: 12.6 % (ref 11.5–15.5)
WBC: 7.6 10*3/uL (ref 4.0–10.5)
nRBC: 0 % (ref 0.0–0.2)

## 2023-05-15 LAB — URINALYSIS, ROUTINE W REFLEX MICROSCOPIC
Bilirubin Urine: NEGATIVE
Glucose, UA: NEGATIVE mg/dL
Hgb urine dipstick: NEGATIVE
Ketones, ur: NEGATIVE mg/dL
Leukocytes,Ua: NEGATIVE
Nitrite: NEGATIVE
Specific Gravity, Urine: 1.027 (ref 1.005–1.030)
pH: 6.5 (ref 5.0–8.0)

## 2023-05-15 NOTE — ED Provider Notes (Signed)
[facility]ERGENC[facility] DEPARTMENT AT [facility] Provider Note   CSN: [id number] Arrival date & time: 05/15/23  1243     History  Chief Complaint  Patient presents with   Flank Pain   HPI Christopher Winters is a 67 y.o. male s/p appendectomy presenting for flank pain.  Started 3 weeks ago.  Located in the left flank.  Feels like a sharp pain.  It is nonradiating.  Denies any trauma to the area.  Denies urinary changes.  States it is intermittent but the pain has been much worse the last couple days prompting his evaluation today.  Denies fever or chills.  Denies nausea, vomiting, diarrhea.  Denies chest pain shortness of breath.  States he has had a kidney stone before.   Flank Pain       Home Medications Prior to Admission medications   Medication Sig Start Date End Date Taking? Authorizing Provider  albuterol (VENTOLIN HFA) 108 (90 Base) MCG/ACT inhaler     [provider]  ALPRAZolam (XANAX) 0.5 MG tablet Take 0.5 mg by mouth 2 (two) times daily as needed. 01/09/20   [provider]  aspirin 81 MG chewable tablet     [provider]  atorvastatin (LIPITOR) 20 MG tablet Take 20 mg by mouth daily. 02/12/20   [provider]  atorvastatin (LIPITOR) 20 MG tablet  02/12/20   [provider]  budesonide-formoterol (SYMBICORT) 160-4.5 MCG/ACT inhaler     [provider]  DULoxetine (CYMBALTA) 30 MG capsule Take 90 mg by mouth daily.     [provider]  DULoxetine (CYMBALTA) 60 MG capsule     [provider]  mirtazapine (REMERON) 15 MG tablet Take 15 mg by mouth at bedtime. 02/15/20   [provider]  sildenafil (REVATIO) 20 MG tablet  02/25/21   [provider]  STIOLTO RESPIMAT 2.5-2.5 MCG/ACT AERS INHALE 2 PUFFS BY MOUTH INTO THE LUNGS DAILY 01/13/21   Martina Sinner, MD  Tiotropium Bromide-Olodaterol (STIOLTO RESPIMAT) 2.5-2.5 MCG/ACT AERS     [provider]  traZODone (DESYREL) 100  MG tablet TAKE 4 TABLETS ONE HOUR BEFORE BED. 02/01/20   [provider]  varenicline (CHANTIX PAK) 0.5 MG X 11 & 1 MG X 42 tablet Take one 0.5 mg tablet by mouth once daily for 3 days, then increase to one 0.5 mg tablet twice daily for 4 days, then increase to one 1 mg tablet twice daily. 10/12/20   Bevelyn Ngo, NP      Allergies    Moxifloxacin hcl    Review of Systems   Review of Systems  Genitourinary:  Positive for flank pain.    Physical Exam Updated Vital Signs BP (!) 163/76   Pulse 65   Temp 98.3 F (36.8 C) (Oral)   Resp 16   Ht 5\' 7"  (1.702 m)   Wt 73.9 kg   SpO2 98%   BMI 25.53 kg/m  Physical Exam Vitals and nursing note reviewed.  HENT:     Head: Normocephalic and atraumatic.     Mouth/Throat:     Mouth: Mucous membranes are moist.  Eyes:     General:        Right eye: No discharge.        Left eye: No discharge.     Conjunctiva/sclera: Conjunctivae normal.  Cardiovascular:     Rate and Rhythm: Normal rate and regular rhythm.     Pulses: Normal pulses.     Heart sounds:  Normal heart sounds.  Pulmonary:     Effort: Pulmonary effort is normal.     Breath sounds: Normal breath sounds.  Abdominal:     General: Abdomen is flat.     Palpations: Abdomen is soft.     Tenderness: There is no abdominal tenderness. There is no left CVA tenderness.  Skin:    General: Skin is warm and dry.  Neurological:     General: No focal deficit present.  Psychiatric:        Mood and Affect: Mood normal.     ED Results / Procedures / Treatments   Labs (all labs ordered are listed, but only abnormal results are displayed) Labs Reviewed  URINALYSIS, ROUTINE W REFLEX MICROSCOPIC - Abnormal; Notable for the following components:      Result Value   Protein, ur TRACE (*)    All other components within normal limits  BASIC METABOLIC PANEL - Abnormal; Notable for the following components:   Glucose, Bld 123 (*)    All other components within normal limits  CBC  - Abnormal; Notable for the following components:   MCV 100.2 (*)    MCH 34.4 (*)    All other components within normal limits    EKG None  Radiology CT Renal Stone Study  Result Date: 05/15/2023 CLINICAL DATA:  Several week history of left flank pain EXAM: CT ABDOMEN AND PELVIS WITHOUT CONTRAST TECHNIQUE: Multidetector CT imaging of the abdomen and pelvis was performed following the standard protocol without IV contrast. RADIATION DOSE REDUCTION: This exam was performed according to the departmental dose-optimization program which includes automated exposure control, adjustment of the mA and/or kV according to patient size and/or use of iterative reconstruction technique. COMPARISON:  CT abdomen and pelvis dated 06/26/2013 FINDINGS: Lower chest: Partially imaged basilar linear atelectasis/scarring. No pleural effusion or pneumothorax demonstrated. Partially imaged heart size is normal. Hepatobiliary: No focal hepatic lesions. No intra or extrahepatic biliary ductal dilation. Normal gallbladder. Pancreas: No focal lesions or main ductal dilation. Spleen: Normal in size without focal abnormality. Adrenals/Urinary Tract: No adrenal nodules. No suspicious renal mass, calculi or hydronephrosis. No focal bladder wall thickening. Stomach/Bowel: Normal appearance of the stomach. No evidence of bowel wall thickening, distention, or inflammatory changes. Colonic diverticulosis without acute diverticulitis. Appendectomy. Vascular/Lymphatic: Aortic atherosclerosis. No enlarged abdominal or pelvic lymph nodes. Reproductive: Prostate is unremarkable. Other: No free fluid, fluid collection, or free air. Musculoskeletal: No acute or abnormal lytic or blastic osseous lesions. Multilevel degenerative changes of the partially imaged thoracic and lumbar spine. Small fat-containing bilateral inguinal hernias. IMPRESSION: 1. No acute abnormality in the abdomen or pelvis. No urolithiasis or urinary tract dilation. 2. Colonic  diverticulosis without acute diverticulitis. 3.  Aortic Atherosclerosis (ICD10-I70.0). Electronically Signed   By: Agustin Cree M.D.   On: 05/15/2023 16:53    Procedures Procedures    Medications Ordered in ED Medications - No data to display  ED Course/ Medical Decision Making/ A&P                             Medical Decision Making Amount and/or Complexity of Data Reviewed Labs: ordered. Radiology: ordered.   Initial Impression and Ddx 67 year old well-appearing male presenting for left flank pain.  Exam was unremarkable without tenderness.  DDx includes nephrolithiasis, pyelonephritis, pneumothorax, soft tissue injury, and diverticulitis. Patient PMH that increases complexity of ED encounter:  h/o kidney stones  Interpretation of Diagnostics I independent reviewed and interpreted the  labs as followed: No acute findings  - I independently visualized the following imaging with scope of interpretation limited to determining acute life threatening conditions related to emergency care: CT ab/pelvis, which revealed no acute abnormality  Patient Reassessment and Ultimate Disposition/Management Patient overall appears clinically well without tenderness on exam.  Did have low suspicion for kidney stone given his history but thankfully CT scan was negative.  Suspect soft tissue injury of some kind.  Advised him to follow-up with PCP.  Patient refused pain medication.  Vital stable discharge.  Discharged home.  Patient management required discussion with the following services or consulting groups:  None  Complexity of Problems Addressed Acute complicated illness or Injury  Additional Data Reviewed and Analyzed Further history obtained from: Further history from spouse/family member, Past medical history and medications listed in the EMR, and Prior ED visit notes  Patient Encounter Risk Assessment None         Final Clinical Impression(s) / ED Diagnoses Final diagnoses:   Flank pain    Rx / DC Orders ED Discharge Orders     None         Gareth Eagle, PA-C 05/15/23 1701    Rondel Baton, MD 05/18/23 1034

## 2023-05-15 NOTE — ED Notes (Signed)
Discharge instructions, follow up care, and pain management reviewed and explained, pt verbalized understanding and had no further questions on d/c. Pt caox4, ambulatory, NAD on d/c.  

## 2023-05-15 NOTE — Discharge Instructions (Signed)
Evaluation for your flank pain was overall reassuring.  Suspect muscle or other soft tissue injury of some kind.  Recommend he follow-up with PCP.  You can apply ice to areas of pain to reduce swelling which is likely the cause of your pain.  If you have worsening flank pain, blood in the urine, painful urination or malodorous urine or any other concerning symptom please return to the emergency department for further evaluation

## 2023-05-15 NOTE — ED Triage Notes (Signed)
Pt arrived from home, POV with L flank pain for the last few weeks. No N/V/D. Hx of kidney stones. Pain 5/10. Afebrile, no dysuria.

## 2024-02-21 ENCOUNTER — Ambulatory Visit
Admission: RE | Admit: 2024-02-21 | Discharge: 2024-02-21 | Disposition: A | Discharge status: Home / Self Care | Payer: 59 | Source: Ambulatory Visit | Attending: Family Medicine

## 2024-02-21 DIAGNOSIS — F17210 Nicotine dependence, cigarettes, uncomplicated: Secondary | ICD-10-CM

## 2024-02-21 DIAGNOSIS — Z87891 Personal history of nicotine dependence: Secondary | ICD-10-CM

## 2024-02-21 DIAGNOSIS — Z122 Encounter for screening for malignant neoplasm of respiratory organs: Secondary | ICD-10-CM

## 2024-03-07 ENCOUNTER — Other Ambulatory Visit: Payer: Self-pay | Admitting: Family Medicine

## 2024-03-07 ENCOUNTER — Encounter: Payer: Self-pay | Admitting: Family Medicine

## 2024-03-07 DIAGNOSIS — Z136 Encounter for screening for cardiovascular disorders: Secondary | ICD-10-CM

## 2024-03-07 DIAGNOSIS — Z720 Tobacco use: Secondary | ICD-10-CM

## 2024-03-11 ENCOUNTER — Ambulatory Visit
Admission: RE | Admit: 2024-03-11 | Discharge: 2024-03-11 | Disposition: A | Discharge status: Home / Self Care | Source: Ambulatory Visit | Attending: Family Medicine

## 2024-03-11 DIAGNOSIS — Z720 Tobacco use: Secondary | ICD-10-CM

## 2024-03-11 DIAGNOSIS — Z136 Encounter for screening for cardiovascular disorders: Secondary | ICD-10-CM

## 2024-03-18 ENCOUNTER — Other Ambulatory Visit: Payer: Self-pay

## 2024-03-18 DIAGNOSIS — Z87891 Personal history of nicotine dependence: Secondary | ICD-10-CM

## 2024-03-18 DIAGNOSIS — F17210 Nicotine dependence, cigarettes, uncomplicated: Secondary | ICD-10-CM

## 2024-03-18 DIAGNOSIS — Z122 Encounter for screening for malignant neoplasm of respiratory organs: Secondary | ICD-10-CM

## 2024-09-03 ENCOUNTER — Telehealth: Payer: Self-pay

## 2024-09-03 NOTE — Telephone Encounter (Signed)
 I s/w the opertor and she tells me that she was able to help confirm the procedure t=o be done at this time.   PROCEDURE: 4 TEETH EXTRACTED; SURGICAL EXTRACTION  ANESTHESIA: LOCAL w/EPI

## 2024-09-03 NOTE — Telephone Encounter (Signed)
 Attempted to contact surgeon's office to receive clarification on which procedure will be performed first, if extractions how many and if surgical or simple

## 2024-09-03 NOTE — Telephone Encounter (Signed)
   Pre-operative Risk Assessment    Patient Name: Christopher Winters  DOB: [birth year] MRN: [id number]   Date of last office visit: 04/27/20 Date of next office visit: Not scheduled  Request for Surgical Clearance    Procedure:  Dental Extraction - Amount of Teeth to be Pulled:  *Not Provided* , Fillings and Crowns  Date of Surgery:  Clearance TBD                                Surgeon:  Not Provided Surgeon's Group or Practice Name:  [facility] Phone number:  [phone] Fax number:  [phone]   Type of Clearance Requested:   - Medical    Type of Anesthesia:  Local    Additional requests/questions:    Christopher Winters Christopher Winters   09/03/2024, 4:28 PM

## 2024-09-03 NOTE — Telephone Encounter (Signed)
   Name: Christopher Winters  DOB: [birth year]  MRN: [id number]  Primary Cardiologist: None  Chart reviewed as part of pre-operative protocol coverage. Because of Harvel Meskill past medical history and time since last visit, he will require a follow-up in-office visit in order to better assess preoperative cardiovascular risk.  Patient needs to reestablish with MD (previously followed by Dr. Barbaraann).  Has not been seen since 2021.  Pre-op covering staff: - Please schedule appointment and call patient to inform them. If patient already had an upcoming appointment within acceptable timeframe, please add pre-op clearance to the appointment notes so provider is aware. - Please contact requesting surgeon's office via preferred method (i.e, phone, fax) to inform them of need for appointment prior to surgery.   Damien JAYSON Braver, NP  09/03/2024, 4:50 PM

## 2024-09-03 NOTE — Telephone Encounter (Signed)
 Left message to call back as pt will need to schedule a NEW PT APPT FOR PREOP CLEARANCE WITH MD PER PREOP APP.

## 2024-09-04 NOTE — Telephone Encounter (Signed)
 2nd attempt to reach the pt to schedule new pt appt for preop clearance.

## 2024-09-05 NOTE — Telephone Encounter (Signed)
 3rd attempt : 09/05/24 Called patient, NA, left message to contact our office to schedule in-office visit.

## 2024-09-06 NOTE — Telephone Encounter (Signed)
 Per preop protocol our office has attempted to reach pt x 3. We will update the requesting office pt needs to call the office to schedule a new pt appt with MD per preop APP.   We will remove from preop call back at this time.

## 2024-10-24 NOTE — Progress Notes (Unsigned)
 Cardiology Office Note:  .   Date:  10/25/2024  ID:  Christopher Winters, DOB July 09, 1956, MRN [id number] PCP: Chrystal Lamarr RAMAN, MD  [facility] Providers Cardiologist:  None   History of Present Illness: .    Chief Complaint  Patient presents with   Pre-op Exam    Christopher Winters is a 68 y.o. male with history of COPD who presents for the evaluation of preop at the request of Chrystal Lamarr RAMAN, MD.  History of Present Illness   Christopher Winters is a 68 year old male with COPD who presents for preoperative assessment. He was referred by Dr. Chrystal for preoperative assessment for dental extractions.  He is preparing for dental extractions for dentures and requires confirmation of fitness for surgery. He has a history of COPD and was previously evaluated in 2021 for shortness of breath, during which a normal echocardiogram and negative stress test were noted. No history of diabetes, heart attack, or stroke. No chest pain but occasional shortness of breath, which is relieved by using a rescue inhaler. He occasionally experiences sciatic nerve pain.  He underwent cataract surgeries on October 24th and November 7th, 2025. His primary care physician is Dr. Chrystal, and the dental procedure will be performed by Dr. Gaither at [facility]  He smokes two packs of cigarettes per day and consumes alcohol occasionally, having a beer in the evening. He works in location manager at [employer] and remains physically active, climbing multiple flights of stairs daily without difficulty. He is married with two children and two grandchildren.  He takes aspirin daily and was previously on atorvastatin for cholesterol management, which was discontinued. He states that his blood pressure is usually low and attributes any elevation to anxiety. He has a history of aortic dilation, which has been monitored, and his cholesterol levels were checked in March 2025, showing favorable results.           Problem List 1. Tobacco abuse -80 pack-year history 2.  COPD -moderate  3. Asc ao dilation -43 mm 03/18/2024 4. HLD -T chol 133, HDL 70, LDL 54, TG 36    ROS: All other ROS reviewed and negative. Pertinent positives noted in the HPI.     Studies Reviewed: SABRA   EKG Interpretation Date/Time:  Friday October 25 2024 13:37:52 EST Ventricular Rate:  75 PR Interval:  136 QRS Duration:  100 QT Interval:  372 QTC Calculation: 415 R Axis:   22  Text Interpretation: Sinus rhythm with Premature atrial complexes Otherwise within normal limits Confirmed by Barbaraann Kotyk [id number] on 10/25/2024 1:41:03 PM   TTE 03/12/2020  1. Normal LV systolic function; mildly dilated ascending aorta.   2. Left ventricular ejection fraction, by estimation, is 55 to 60%. The  left ventricle has normal function. The left ventricle has no regional  wall motion abnormalities. Left ventricular diastolic parameters were  normal.   3. Right ventricular systolic function is normal. The right ventricular  size is normal.   4. The mitral valve is normal in structure. Trivial mitral valve  regurgitation. No evidence of mitral stenosis.   5. The aortic valve is tricuspid. Aortic valve regurgitation is not  visualized. No aortic stenosis is present.   6. Aortic dilatation noted. There is mild dilatation of the aortic root  and of the ascending aorta measuring 40 mm.   7. The inferior vena cava is normal in size with greater than 50%  respiratory variability, suggesting right atrial pressure of 3 mmHg.  NM SPECT 03/12/2020 The left ventricular ejection fraction is mildly decreased (45-54%). Nuclear stress EF: 52%. There was no ST segment deviation noted during stress. The study is normal. This is a low risk study. No prior study for comparison. Physical Exam:   VS:  BP (!) 150/86   Pulse 75   Ht 5' 6 (1.676 m)   Wt 152 lb (68.9 kg)   SpO2 95%   BMI 24.53 kg/m    Wt Readings from Last 3 Encounters:   10/25/24 152 lb (68.9 kg)  05/15/23 163 lb (73.9 kg)  05/17/22 167 lb 3.2 oz (75.8 kg)    GEN: Well nourished, well developed in no acute distress NECK: No JVD; No carotid bruits CARDIAC: RRR, no murmurs, rubs, gallops RESPIRATORY:  Clear to auscultation without rales, wheezing or rhonchi  ABDOMEN: Soft, non-tender, non-distended EXTREMITIES:  No edema; No deformity  ASSESSMENT AND PLAN: .   Assessment and Plan    Preoperative cardiovascular risk assessment Cleared for dental extractions. Shortness of breath due to COPD. EKG shows asymptomatic PACs. No coronary calcium on CT. - Proceed with dental extractions. - Can complete >4 METS without limitations - Updated echocardiogram given PACs, but this should not delay dental extractions   Chronic obstructive pulmonary disease (COPD) COPD with occasional dyspnea managed with rescue inhaler. Continues smoking, exacerbating symptoms. - Continue use of rescue inhaler as needed. - Encouraged smoking cessation.  Atrial premature complexes (PACs) EKG shows asymptomatic PACs. No history of palpitations or tachycardia. Previous echocardiogram normal. - Update echocardiogram  Thoracic aortic dilation Aortic dilation at 43 mm on CT. No acute symptoms. - Continue yearly surveillance of aortic dilation.  Mixed hyperlipidemia Cholesterol controlled with atorvastatin. LDL at goal. No coronary calcium on CT. - Continue atorvastatin 20 mg daily.  Tobacco use disorder Continues smoking two packs per day. Smoking cessation counseling provided. - Encouraged smoking cessation. - Provided 3 min smoking cessation counseling.              Follow-up: Return in about 1 year (around 10/25/2025).  Signed, Darryle DASEN. Barbaraann, MD, [facility]  [facility]  [address] [place], [place] [address] [phone]  1:53 PM

## 2024-10-25 ENCOUNTER — Ambulatory Visit: Attending: Cardiovascular Disease | Admitting: Cardiovascular Disease

## 2024-10-25 ENCOUNTER — Encounter: Payer: Self-pay | Admitting: Cardiovascular Disease

## 2024-10-25 VITALS — BP 150/86 | HR 75 | Ht 66.0 in | Wt 152.0 lb

## 2024-10-25 DIAGNOSIS — I77810 Thoracic aortic ectasia: Secondary | ICD-10-CM | POA: Diagnosis not present

## 2024-10-25 DIAGNOSIS — Z01810 Encounter for preprocedural cardiovascular examination: Secondary | ICD-10-CM

## 2024-10-25 DIAGNOSIS — E782 Mixed hyperlipidemia: Secondary | ICD-10-CM

## 2024-10-25 DIAGNOSIS — I491 Atrial premature depolarization: Secondary | ICD-10-CM

## 2024-10-25 DIAGNOSIS — Z720 Tobacco use: Secondary | ICD-10-CM

## 2024-10-25 NOTE — Patient Instructions (Signed)
 Medication Instructions:  Your physician recommends that you continue on your current medications as directed. Please refer to the Current Medication list given to you today.  *If you need a refill on your cardiac medications before your next appointment, please call your pharmacy*  Testing/Procedures: Your physician has requested that you have an echocardiogram. Echocardiography is a painless test that uses sound waves to create images of your heart. It provides your doctor with information about the size and shape of your heart and how well your heart's chambers and valves are working. This procedure takes approximately one hour. There are no restrictions for this procedure. Please do NOT wear cologne, perfume, aftershave, or lotions (deodorant is allowed). Please arrive 15 minutes prior to your appointment time.  Please note: We ask at that you not bring children with you during ultrasound (echo/ vascular) testing. Due to room size and safety concerns, children are not allowed in the ultrasound rooms during exams. Our front office staff cannot provide observation of children in our lobby area while testing is being conducted. An adult accompanying a patient to their appointment will only be allowed in the ultrasound room at the discretion of the ultrasound technician under special circumstances. We apologize for any inconvenience.   Follow-Up: At [facility], you and your health needs are our priority.  As part of our continuing mission to provide you with exceptional heart care, our providers are all part of one team.  This team includes your primary Cardiologist (physician) and Advanced Practice Providers or APPs (Physician Assistants and Nurse Practitioners) who all work together to provide you with the care you need, when you need it.  Your next appointment:   12 month(s)  Provider:   Jon Hails, PA-C, Callie Goodrich, PA-C, Kathleen Johnson, PA-C, Hao Meng, PA-C, Damien Braver,  NP, or Katlyn West, NP

## 2024-11-04 NOTE — Telephone Encounter (Signed)
 [facility] is calling to f/u please advise

## 2024-11-04 NOTE — Telephone Encounter (Signed)
 I have reviewed Dr. Barbaraann notes which he provides notes for clearance:  ASSESSMENT AND PLAN: .   Assessment and Plan    Preoperative cardiovascular risk assessment Cleared for dental extractions. Shortness of breath due to COPD. EKG shows asymptomatic PACs. No coronary calcium on CT. - Proceed with dental extractions. - Can complete >4 METS without limitations - Updated echocardiogram given PACs, but this should not delay dental extractions     I will fax these notes from DR. O'Neal to the dental office.

## 2024-12-10 ENCOUNTER — Ambulatory Visit (HOSPITAL_COMMUNITY)
Admission: RE | Admit: 2024-12-10 | Discharge: 2024-12-10 | Disposition: A | Discharge status: Home / Self Care | Source: Ambulatory Visit | Attending: Cardiovascular Disease | Admitting: Cardiovascular Disease

## 2024-12-10 ENCOUNTER — Telehealth: Payer: Self-pay | Admitting: Cardiovascular Disease

## 2024-12-10 DIAGNOSIS — Z720 Tobacco use: Secondary | ICD-10-CM | POA: Diagnosis not present

## 2024-12-10 DIAGNOSIS — I77810 Thoracic aortic ectasia: Secondary | ICD-10-CM | POA: Insufficient documentation

## 2024-12-10 DIAGNOSIS — E782 Mixed hyperlipidemia: Secondary | ICD-10-CM | POA: Diagnosis not present

## 2024-12-10 DIAGNOSIS — I491 Atrial premature depolarization: Secondary | ICD-10-CM | POA: Insufficient documentation

## 2024-12-10 NOTE — Telephone Encounter (Signed)
 Requesting that the clearance form be filled out. As they did receive something back but it was not the form. Please advise.

## 2024-12-10 NOTE — Telephone Encounter (Signed)
 Preop clearance from 11/14 has now been routed to requesting office ([facility]) per preop protocol, the request are not filled out via form received but imported into our Cardiac preop clearance during the OV encounter.

## 2024-12-11 ENCOUNTER — Telehealth (HOSPITAL_BASED_OUTPATIENT_CLINIC_OR_DEPARTMENT_OTHER): Payer: Self-pay

## 2024-12-11 LAB — ECHOCARDIOGRAM COMPLETE
Area-P 1/2: 3.45 cm2
S' Lateral: 3.7 cm

## 2024-12-11 NOTE — Telephone Encounter (Signed)
"  ° °  Patient Name: Christopher Winters  DOB: 11/05/1956 MRN: [id number]  Primary Cardiologist: None  Chart reviewed as part of pre-operative protocol coverage.   IF SIMPLE EXTRACTION/CLEANINGS: Simple dental extractions (i.e. 1-2 teeth) are considered low risk procedures per guidelines and generally do not require any specific cardiac clearance. It is also generally accepted that for simple extractions and dental cleanings, there is no need to interrupt blood thinner therapy.  Per Dr. Barbaraann 10/25/2024 Preoperative cardiovascular risk assessment Cleared for dental extractions. Shortness of breath due to COPD. EKG shows asymptomatic PACs. No coronary calcium on CT. - Proceed with dental extractions. - Can complete >4 METS without limitations - Updated echocardiogram given PACs, but this should not delay dental extractions   SBE prophylaxis is not required for the patient from a cardiac standpoint.  I will route this recommendation to the requesting party via Epic fax function and remove from pre-op pool.  Please call with questions.  Lamarr Satterfield, NP 12/11/2024, 11:21 AM  "

## 2024-12-11 NOTE — Telephone Encounter (Signed)
"  ° °  Pre-operative Risk Assessment    Patient Name: Christopher Winters  DOB: [birth year] MRN: [id number]   Date of last office visit: 10/25/24 with O'Neal  Date of next office visit: NA  Request for Surgical Clearance    Procedure:  Dental Extraction - Amount of Teeth to be Pulled:  2 (for the first appt) - Surgical; PT will have 2 more extracted at a later time   Date of Surgery:  Clearance TBD                                 Surgeon:  Dr. Gaither Socks Group or Practice Name:  [facility]  Phone number:  [phone] Fax number:  [phone]   Type of Clearance Requested:   - Medical  - Pharmacy:  Hold Aspirin not indicated   Type of Anesthesia:  Local    Additional requests/questions:    SignedAugustin JONETTA Daring   12/11/2024, 11:09 AM   "

## 2024-12-15 ENCOUNTER — Ambulatory Visit: Payer: Self-pay | Admitting: Cardiovascular Disease
